# Patient Record
Sex: Male | Born: 1965
Health system: Southern US, Community
[De-identification: ages and names within clinical notes are randomized; demographics above are authoritative.]

## PROBLEM LIST (undated history)

## (undated) DIAGNOSIS — F329 Major depressive disorder, single episode, unspecified: Secondary | ICD-10-CM

## (undated) DIAGNOSIS — Z5189 Encounter for other specified aftercare: Secondary | ICD-10-CM

## (undated) DIAGNOSIS — S62101A Fracture of unspecified carpal bone, right wrist, initial encounter for closed fracture: Secondary | ICD-10-CM

## (undated) DIAGNOSIS — D689 Coagulation defect, unspecified: Secondary | ICD-10-CM

## (undated) DIAGNOSIS — W3400XA Accidental discharge from unspecified firearms or gun, initial encounter: Secondary | ICD-10-CM

## (undated) HISTORY — DX: Encounter for other specified aftercare: Z51.89

## (undated) HISTORY — DX: Coagulation defect, unspecified: D68.9

## (undated) HISTORY — DX: Fracture of unspecified carpal bone, right wrist, initial encounter for closed fracture: S62.101A

## (undated) HISTORY — DX: Major depressive disorder, single episode, unspecified: F32.9

## (undated) HISTORY — DX: Accidental discharge from unspecified firearms or gun, initial encounter: W34.00XA

---

## 1993-11-19 DIAGNOSIS — S62101A Fracture of unspecified carpal bone, right wrist, initial encounter for closed fracture: Secondary | ICD-10-CM

## 1993-11-19 HISTORY — DX: Fracture of unspecified carpal bone, right wrist, initial encounter for closed fracture: S62.101A

## 1993-11-19 HISTORY — PX: WRIST FRACTURE SURGERY: SHX121

## 2000-11-19 DIAGNOSIS — W3400XA Accidental discharge from unspecified firearms or gun, initial encounter: Secondary | ICD-10-CM

## 2000-11-19 HISTORY — PX: ABDOMINAL EXPLORATION SURGERY: SHX538

## 2000-11-19 HISTORY — DX: Accidental discharge from unspecified firearms or gun, initial encounter: W34.00XA

## 2001-05-04 ENCOUNTER — Encounter: Payer: Self-pay | Admitting: General Surgery

## 2001-05-04 ENCOUNTER — Inpatient Hospital Stay (HOSPITAL_COMMUNITY): Admission: AC | Admit: 2001-05-04 | Discharge: 2001-05-26 | Payer: Self-pay | Admitting: *Deleted

## 2001-05-05 ENCOUNTER — Encounter: Payer: Self-pay | Admitting: General Surgery

## 2001-05-06 ENCOUNTER — Encounter: Payer: Self-pay | Admitting: General Surgery

## 2001-05-08 ENCOUNTER — Encounter: Payer: Self-pay | Admitting: General Surgery

## 2001-05-09 ENCOUNTER — Encounter: Payer: Self-pay | Admitting: General Surgery

## 2001-05-10 ENCOUNTER — Encounter: Payer: Self-pay | Admitting: General Surgery

## 2001-05-11 ENCOUNTER — Encounter: Payer: Self-pay | Admitting: General Surgery

## 2001-05-12 ENCOUNTER — Encounter: Payer: Self-pay | Admitting: General Surgery

## 2001-05-13 ENCOUNTER — Encounter: Payer: Self-pay | Admitting: General Surgery

## 2001-05-15 ENCOUNTER — Encounter: Payer: Self-pay | Admitting: General Surgery

## 2001-05-16 ENCOUNTER — Encounter: Payer: Self-pay | Admitting: General Surgery

## 2001-05-17 ENCOUNTER — Encounter: Payer: Self-pay | Admitting: General Surgery

## 2001-05-18 ENCOUNTER — Encounter: Payer: Self-pay | Admitting: General Surgery

## 2001-05-19 ENCOUNTER — Encounter: Payer: Self-pay | Admitting: General Surgery

## 2001-05-20 ENCOUNTER — Encounter: Payer: Self-pay | Admitting: General Surgery

## 2001-06-09 ENCOUNTER — Ambulatory Visit (HOSPITAL_COMMUNITY): Admission: RE | Admit: 2001-06-09 | Discharge: 2001-06-09 | Payer: Self-pay | Admitting: *Deleted

## 2001-06-13 ENCOUNTER — Encounter: Payer: Self-pay | Admitting: Emergency Medicine

## 2001-06-13 ENCOUNTER — Emergency Department (HOSPITAL_COMMUNITY): Admission: EM | Admit: 2001-06-13 | Discharge: 2001-06-13 | Payer: Self-pay | Admitting: Emergency Medicine

## 2001-09-16 ENCOUNTER — Ambulatory Visit (HOSPITAL_COMMUNITY): Admission: RE | Admit: 2001-09-16 | Discharge: 2001-09-16 | Payer: Self-pay | Admitting: Family Medicine

## 2003-01-12 ENCOUNTER — Emergency Department (HOSPITAL_COMMUNITY): Admission: EM | Admit: 2003-01-12 | Discharge: 2003-01-12 | Payer: Self-pay

## 2003-04-28 ENCOUNTER — Emergency Department (HOSPITAL_COMMUNITY): Admission: EM | Admit: 2003-04-28 | Discharge: 2003-04-28 | Payer: Self-pay | Admitting: *Deleted

## 2005-11-26 ENCOUNTER — Emergency Department (HOSPITAL_COMMUNITY): Admission: EM | Admit: 2005-11-26 | Discharge: 2005-11-26 | Payer: Self-pay | Admitting: Family Medicine

## 2007-09-25 ENCOUNTER — Emergency Department (HOSPITAL_COMMUNITY): Admission: EM | Admit: 2007-09-25 | Discharge: 2007-09-26 | Payer: Self-pay | Admitting: Emergency Medicine

## 2010-11-01 ENCOUNTER — Emergency Department (HOSPITAL_COMMUNITY)
Admission: EM | Admit: 2010-11-01 | Discharge: 2010-11-01 | Payer: Self-pay | Source: Home / Self Care | Admitting: Family Medicine
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
## 2011-04-06 NOTE — Op Note (Signed)
[place]. [facility]  Patient:    Todd Holt, Todd Holt                     MRN: [id number] Proc. Date: 05/07/01 Adm. Date:  [id number] Attending:  Trauma, Md                           Operative Report  PREOPERATIVE DIAGNOSIS:  Open abdomen, status post damage control and repair of duodenum and retroperitoneal hemorrhage.  POSTOPERATIVE DIAGNOSIS:  Open abdomen requiring closure.  PROCEDURES: 1. Hepatorrhaphy. 2. Closure of abdominal wound.  SURGEON:  Jimmye Norman, M.D.  ASSISTANT:  Eugenia Pancoast, P.A.  ANESTHESIA:  General endotracheal.  ESTIMATED BLOOD LOSS:  Less than 50 cc.  COMPLICATIONS:  None.  CONDITION:  Stable.  DISPOSITION:  He was still in critical condition when taken back to the ICU directly.  DRAINS:  A 19 mm Blake drain was left coming out of the right upper quadrant, going across the left lobe of the liver.  FINDINGS:  The patient had a small liver laceration on the anterior edge of the left lobe of the liver, which was actively exuding bile.  This was sutured, and a drain was placed anterior to this area.  There was no other further bleeding noted.  There was old blood in the abdomen but no evidence of active bleeding.  The previous anastomoses appeared to be intact and were not stressed during the procedure.  DESCRIPTION OF PROCEDURE:  The patient was taken to the operating room, placed on the table in a supine position.  After an adequate amount of general anesthetic was administered, he was prepped by cutting off the Vi-Drape that had previously been placed and prepped with Betadine, scrubbing the Vi-Drape along the midline.  Initially the _____ bag, which had been sutured to the patients skin circumferentially, was cut out using heavy scissors.  This exposed the small bowel and omentum underneath, which was not extruded outward under pressure. We subsequently explored the belly, starting in the superior aspect of  the wound, where there was noted to be the small liver laceration on the anterior edge of the left lobe of the liver.  Because of bilious drainage there, a figure-of-eight stitch of 0 chromic on a blunt-tip needle was used to suture off this place and then a drain placed anterior to it and brought out the right upper quadrant of the abdomen.  The drain was sutured in place with a 3-0 nylon.  Once this was done, we actively explored all four quadrants of the abdomen, where old blood was found mostly in the left upper quadrant but no active bleeding.  Another retroperitoneal drain was noted intra-abdominally.  It had been draped out of the field externally, and it was not displaced during the procedure.  We explored all four quadrants.  There was no further bleeding, and once were convinced that there was no evidence of undetected perforation or uncontrolled bleeding, we closed the abdomen.  The fascia was reapproximated using a running #1 PDS suture.  Three intervening retention sutures were placed of #1 nylon, which was placed external to the peritoneum using the double needle provided with the suture.  Bridges were used to secure the suture on the anterior abdominal wall after the fascia was closed, and the skin was closed using stainless steel staples.  Not much tension was placed on each retention suture.  They will likely be removed  in the near future.  Once the abdomen was closed completely, we placed a sterile dressing.  The patient was taken directly back to the intensive care unit in stable condition.  Although stable, the patient was critical. DD:  05/08/01 TD:  05/08/01 Job: [id number]

## 2011-08-28 LAB — COMPREHENSIVE METABOLIC PANEL
ALT: 25
AST: 25
Albumin: 3.8
Alkaline Phosphatase: 60
BUN: 10
CO2: 31
Calcium: 9.3
Chloride: 107
Creatinine, Ser: 1.22
GFR calc Af Amer: >60
GFR calc non Af Amer: >60
Glucose, Bld: 104 — ABNORMAL HIGH
Potassium: 4.1
Sodium: 140
Total Bilirubin: 0.7
Total Protein: 6.9

## 2011-08-28 LAB — DIFFERENTIAL
Basophils Absolute: 0
Basophils Relative: 0
Eosinophils Relative: 1
Lymphocytes Relative: 20
Monocytes Absolute: 0.4

## 2011-08-28 LAB — CBC
HCT: 36.3 — ABNORMAL LOW
Hemoglobin: 12.1 — ABNORMAL LOW
MCHC: 33.3
MCV: 88.9
Platelets: 247
RBC: 4.09 — ABNORMAL LOW
RDW: 13.2
WBC: 6.8

## 2011-08-28 LAB — POCT URINALYSIS DIP (DEVICE)
Bilirubin Urine: NEGATIVE
Glucose, UA: NEGATIVE
Hgb urine dipstick: NEGATIVE
Ketones, ur: NEGATIVE
Nitrite: NEGATIVE
Operator id: 247071
Protein, ur: 30 — AB
Specific Gravity, Urine: >=1.03
Urobilinogen, UA: 1
pH: 6.5

## 2013-07-01 ENCOUNTER — Encounter (HOSPITAL_COMMUNITY): Payer: Self-pay | Admitting: Emergency Medicine

## 2013-07-01 ENCOUNTER — Emergency Department (HOSPITAL_COMMUNITY)
Admission: EM | Admit: 2013-07-01 | Discharge: 2013-07-01 | Disposition: A | Discharge status: Home / Self Care | Payer: BC Managed Care – PPO | Source: Home / Self Care | Attending: Emergency Medicine | Admitting: Emergency Medicine

## 2013-07-01 DIAGNOSIS — L03019 Cellulitis of unspecified finger: Secondary | ICD-10-CM

## 2013-07-01 DIAGNOSIS — L03012 Cellulitis of left finger: Secondary | ICD-10-CM

## 2013-07-01 MED ORDER — AMOXICILLIN-POT CLAVULANATE 500-125 MG PO TABS: 1.0000 | ORAL_TABLET | Freq: Three times a day (TID) | ORAL | Status: AC

## 2013-07-01 MED ORDER — HYDROCODONE-ACETAMINOPHEN 5-325 MG PO TABS: 2.0000 | ORAL_TABLET | Freq: Four times a day (QID) | ORAL | Status: DC | PRN

## 2013-07-01 NOTE — ED Notes (Signed)
C/o left ring finger pain which started on Thursday.  Patient states it is swollen.  Patient went to urgent care in [place] and was given pain medication and antibiotics: tramadol and cephalexin

## 2013-07-01 NOTE — ED Provider Notes (Signed)
CSN: [id number]     Arrival date & time 07/01/13  1817 History     First MD Initiated Contact with Patient 07/01/13 2001     Chief Complaint  Patient presents with  . Hand Pain   (Consider location/radiation/quality/duration/timing/severity/associated sxs/prior Treatment) HPI Comments: Patient presents urgent care this evening complaining of an ongoing infection to the medial aspect of his left ring finger. Patient had been seen at urgent care [facility] what he was prescribed antibiotics (Keflex). Is out of her pain travel he describes that the area besides his nail has gotten red and more swollen since then and is not responding to any treatment. It is painful at touch. Denies any fevers or pain to the rest of his hand such as the tip of his finger nor the palmar surface of her finger.  Patient is a 47 y.o. male presenting with hand pain. The history is provided by the patient.  Hand Pain This is a new problem. The current episode started more than 2 days ago. The problem occurs constantly. The problem has been gradually worsening. Treatments tried: Keflex and tramadol.    History reviewed. No pertinent past medical history. No past surgical history on file. No family history on file. History  Substance Use Topics  . Smoking status: Not on file  . Smokeless tobacco: Not on file  . Alcohol Use: Not on file    Review of Systems  Constitutional: Negative for fever, activity change, appetite change and unexpected weight change.  Musculoskeletal: Negative for myalgias, back pain, joint swelling and gait problem.  Skin: Positive for color change and wound.    Allergies  Review of patient's allergies indicates no known allergies.  Home Medications   Current Outpatient Rx  Name  Route  Sig  Dispense  Refill  . traMADol (ULTRAM) 50 MG tablet   Oral   Take 50 mg by mouth every 6 (six) hours as needed for pain.         Marland Kitchen amoxicillin-clavulanate (AUGMENTIN) 500-125 MG per  tablet   Oral   Take 1 tablet (500 mg total) by mouth 3 (three) times daily.   21 tablet   0   . HYDROcodone-acetaminophen (NORCO/VICODIN) 5-325 MG per tablet   Oral   Take 2 tablets by mouth every 6 (six) hours as needed for pain.   10 tablet   0    BP 147/80  Pulse 77  Temp(Src) 98.1 F (36.7 C) (Oral)  Resp 18  SpO2 97% Physical Exam  Vitals reviewed. Constitutional: Vital signs are normal. He appears well-developed and well-nourished.  Musculoskeletal: He exhibits tenderness.       Hands: Neurological: He is alert.  Skin: No rash noted. There is erythema.    ED Course   Drain paronychia Date/Time: 07/01/2013 8:43 PM Performed by: Jaslynn Thome Authorized by: Jimmie Molly Consent: Verbal consent obtained. Consent given by: patient Patient identity confirmed: verbally with patient Patient sedated: no Patient tolerance: Patient tolerated the procedure well with no immediate complications. Comments: Is 11 blade and puncture site a paronychia obtaining a purulent discharge a sample was collected for cultures.   (including critical care time)  Labs Reviewed  CULTURE, ROUTINE-ABSCESS   No results found. 1. Paronychia of finger, left     MDM  Left fourth finger paronychia  Patient tolerated I&D, was able to drain moderate amount of purulent exudate Patient has been instructed to soak his affected finger in soapy luke- water.  2 discontinue Keflex and start with  Augmentin. Patient was instructed that if no improvement is noted in the next 48 hours or infection spreads to the tip of his finger or to the rest of his finger as explained to return or go to the emergency department. Patient understands discharge instructions and agreed with treatment plan and followup care as necessary.  Jimmie Molly, MD 07/01/13 2045

## 2013-07-03 NOTE — ED Notes (Signed)
Chart review.

## 2013-07-05 LAB — CULTURE, ROUTINE-ABSCESS

## 2013-07-06 NOTE — ED Notes (Signed)
Abscess culture L finger: few staph species (coagulase negative).  Pt. adequately treated with Augementin. Todd Holt 07/06/2013

## 2013-09-07 ENCOUNTER — Ambulatory Visit: Payer: BC Managed Care – PPO | Admitting: Family Medicine

## 2013-10-08 ENCOUNTER — Ambulatory Visit (INDEPENDENT_AMBULATORY_CARE_PROVIDER_SITE_OTHER): Payer: BC Managed Care – PPO | Admitting: Family Medicine

## 2013-10-08 ENCOUNTER — Encounter: Payer: Self-pay | Admitting: Family Medicine

## 2013-10-08 VITALS — BP 120/80 | HR 76 | Temp 98.4°F | Ht 67.0 in | Wt 200.2 lb

## 2013-10-08 DIAGNOSIS — Z113 Encounter for screening for infections with a predominantly sexual mode of transmission: Secondary | ICD-10-CM

## 2013-10-08 DIAGNOSIS — L84 Corns and callosities: Secondary | ICD-10-CM

## 2013-10-08 DIAGNOSIS — Z23 Encounter for immunization: Secondary | ICD-10-CM

## 2013-10-08 DIAGNOSIS — Z0000 Encounter for general adult medical examination without abnormal findings: Secondary | ICD-10-CM | POA: Insufficient documentation

## 2013-10-08 MED ORDER — UREA 40 % EX CREA: TOPICAL_CREAM | CUTANEOUS | Status: DC

## 2013-10-08 NOTE — Assessment & Plan Note (Signed)
Preventative protocols reviewed and updated unless pt declined. Discussed healthy diet and lifestyle.   Std screen per patient request.

## 2013-10-08 NOTE — Assessment & Plan Note (Signed)
?  plantar wart causing callus on right lateral sole. Recommended treat with urea cream (sent to pharmacy), pumice stone, and moisturizing daily. Update if this doesn't help.

## 2013-10-08 NOTE — Addendum Note (Signed)
Addended by: Josph Macho A on: 10/08/2013 04:18 PM   Modules accepted: Orders

## 2013-10-08 NOTE — Patient Instructions (Signed)
Tdap today (tetanus and pertussis)  For callus - try urea cream (sent to pharmacy) then file down callus with pumice stone, then wash feet and moisturize - do this daily for several weeks. Return at your convenience fasting for blood work (6-8 hours).  We will check cholesterol levels then as well as sugar and HIV/syphillis. Good to meet you today, call us with questions.

## 2013-10-08 NOTE — Progress Notes (Signed)
Pre-visit discussion using our clinic review tool. No additional management support is needed unless otherwise documented below in the visit note.  
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
## 2013-10-20 ENCOUNTER — Other Ambulatory Visit (INDEPENDENT_AMBULATORY_CARE_PROVIDER_SITE_OTHER): Payer: BC Managed Care – PPO

## 2013-10-20 DIAGNOSIS — Z0000 Encounter for general adult medical examination without abnormal findings: Secondary | ICD-10-CM

## 2013-10-20 DIAGNOSIS — Z113 Encounter for screening for infections with a predominantly sexual mode of transmission: Secondary | ICD-10-CM

## 2013-10-20 LAB — BASIC METABOLIC PANEL
CO2: 28 mEq/L (ref 19–32)
Calcium: 9.5 mg/dL (ref 8.4–10.5)
Creatinine, Ser: 1.1 mg/dL (ref 0.4–1.5)

## 2013-10-20 LAB — RPR

## 2013-10-20 LAB — LIPID PANEL
Cholesterol: 231 mg/dL — ABNORMAL HIGH (ref 0–200)
Total CHOL/HDL Ratio: 4
Triglycerides: 81 mg/dL (ref 0.0–149.0)

## 2013-10-21 LAB — HIV ANTIBODY (ROUTINE TESTING W REFLEX): HIV: NONREACTIVE

## 2013-10-22 ENCOUNTER — Encounter: Payer: Self-pay | Admitting: *Deleted

## 2013-12-20 DIAGNOSIS — F32A Depression, unspecified: Secondary | ICD-10-CM

## 2013-12-20 HISTORY — DX: Depression, unspecified: F32.A

## 2013-12-28 ENCOUNTER — Encounter (HOSPITAL_COMMUNITY): Payer: Self-pay | Admitting: Behavioral Health

## 2013-12-28 ENCOUNTER — Encounter (HOSPITAL_COMMUNITY): Payer: Self-pay | Admitting: Emergency Medicine

## 2013-12-28 ENCOUNTER — Emergency Department (HOSPITAL_COMMUNITY)
Admission: EM | Admit: 2013-12-28 | Discharge: 2013-12-28 | Disposition: A | Discharge status: Other Acute Inpatient Hospital | Payer: BC Managed Care – PPO | Attending: Emergency Medicine | Admitting: Emergency Medicine

## 2013-12-28 ENCOUNTER — Inpatient Hospital Stay (HOSPITAL_COMMUNITY)
Admission: RE | Admit: 2013-12-28 | Discharge: 2013-12-31 | DRG: 885 | Disposition: A | Discharge status: Home / Self Care | Payer: BC Managed Care – PPO | Source: Intra-hospital | Attending: Psychiatry | Admitting: Psychiatry

## 2013-12-28 DIAGNOSIS — F332 Major depressive disorder, recurrent severe without psychotic features: Principal | ICD-10-CM | POA: Diagnosis present

## 2013-12-28 DIAGNOSIS — F419 Anxiety disorder, unspecified: Secondary | ICD-10-CM

## 2013-12-28 DIAGNOSIS — R45851 Suicidal ideations: Secondary | ICD-10-CM

## 2013-12-28 DIAGNOSIS — Z87891 Personal history of nicotine dependence: Secondary | ICD-10-CM | POA: Insufficient documentation

## 2013-12-28 DIAGNOSIS — F329 Major depressive disorder, single episode, unspecified: Secondary | ICD-10-CM | POA: Diagnosis present

## 2013-12-28 DIAGNOSIS — F1010 Alcohol abuse, uncomplicated: Secondary | ICD-10-CM | POA: Diagnosis present

## 2013-12-28 DIAGNOSIS — Z8781 Personal history of (healed) traumatic fracture: Secondary | ICD-10-CM | POA: Insufficient documentation

## 2013-12-28 DIAGNOSIS — F411 Generalized anxiety disorder: Secondary | ICD-10-CM | POA: Insufficient documentation

## 2013-12-28 LAB — COMPREHENSIVE METABOLIC PANEL
ALT: 23 U/L (ref 0–53)
AST: 22 U/L (ref 0–37)
Albumin: 4.5 g/dL (ref 3.5–5.2)
Alkaline Phosphatase: 54 U/L (ref 39–117)
BUN: 12 mg/dL (ref 6–23)
CALCIUM: 9.7 mg/dL (ref 8.4–10.5)
CO2: 27 meq/L (ref 19–32)
CREATININE: 0.94 mg/dL (ref 0.50–1.35)
Chloride: 100 mEq/L (ref 96–112)
GLUCOSE: 106 mg/dL — AB (ref 70–99)
Potassium: 4.4 mEq/L (ref 3.7–5.3)
Sodium: 139 mEq/L (ref 137–147)
Total Bilirubin: 1.1 mg/dL (ref 0.3–1.2)
Total Protein: 8.3 g/dL (ref 6.0–8.3)

## 2013-12-28 LAB — ACETAMINOPHEN LEVEL

## 2013-12-28 LAB — CBC
HEMATOCRIT: 42.6 % (ref 39.0–52.0)
HEMOGLOBIN: 14.3 g/dL (ref 13.0–17.0)
MCH: 29.8 pg (ref 26.0–34.0)
MCHC: 33.6 g/dL (ref 30.0–36.0)
MCV: 88.8 fL (ref 78.0–100.0)
Platelets: 266 10*3/uL (ref 150–400)
RBC: 4.8 MIL/uL (ref 4.22–5.81)
RDW: 13.1 % (ref 11.5–15.5)
WBC: 5.4 10*3/uL (ref 4.0–10.5)

## 2013-12-28 LAB — SALICYLATE LEVEL

## 2013-12-28 LAB — RAPID URINE DRUG SCREEN, HOSP PERFORMED
Amphetamines: NOT DETECTED
Barbiturates: NOT DETECTED
Benzodiazepines: NOT DETECTED
Cocaine: NOT DETECTED
OPIATES: NOT DETECTED
Tetrahydrocannabinol: NOT DETECTED

## 2013-12-28 LAB — ETHANOL: Alcohol, Ethyl (B): <11 mg/dL (ref 0–11)

## 2013-12-28 MED ORDER — TRAZODONE HCL 50 MG PO TABS
50.0000 mg | ORAL_TABLET | Freq: Every evening | ORAL | Status: DC | PRN
  Administered 2013-12-28 – 2013-12-29 (×2): 50 mg via ORAL
  Filled 2013-12-28 (×2): qty 1

## 2013-12-28 MED ORDER — HYDROXYZINE HCL 25 MG PO TABS
25.0000 mg | ORAL_TABLET | Freq: Four times a day (QID) | ORAL | Status: DC | PRN
  Filled 2013-12-28: qty 10

## 2013-12-28 MED ORDER — ALUM & MAG HYDROXIDE-SIMETH 200-200-20 MG/5ML PO SUSP: 30.0000 mL | ORAL | Status: DC | PRN

## 2013-12-28 MED ORDER — ACETAMINOPHEN 325 MG PO TABS: 650.0000 mg | ORAL_TABLET | Freq: Four times a day (QID) | ORAL | Status: DC | PRN

## 2013-12-28 MED ORDER — MAGNESIUM HYDROXIDE 400 MG/5ML PO SUSP: 30.0000 mL | Freq: Every day | ORAL | Status: DC | PRN

## 2013-12-28 MED ORDER — FLUOXETINE HCL 20 MG PO CAPS
20.0000 mg | ORAL_CAPSULE | Freq: Every day | ORAL | Status: DC
  Administered 2013-12-28 – 2013-12-31 (×4): 20 mg via ORAL
  Filled 2013-12-28: qty 3
  Filled 2013-12-28 (×6): qty 1

## 2013-12-28 NOTE — BHH Counselor (Addendum)
Pt signed support paperwork. Is going to bed 503-1 per Ava. Writer spoke w/ Charge RN Butch Penny who sts bed isn't ready yet as maintenance being done on Room 503 shower.   Arnold Long, [place] Assessment Counselor
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
## 2013-12-28 NOTE — ED Notes (Signed)
Pt sent here for medical clearance by [facility]. PT states he has felt stress recently. Denies SI/HI. Pt occasionally drinks etoh, last drink Saturday. PT stressed due to family situation

## 2013-12-28 NOTE — Progress Notes (Signed)
The patient is a walk-in assessment at [facility].  Writer consulted with Dr. Darleene Cleaver regarding the patient.  Per Dr. Darleene Cleaver the patient has been accepted to [facility] Bed 503-1.    Writer informed the charge nurse that the patient will be coming to [facility] for medical clearance.  Writer also informed Theodoro Clock, NP stationed  [facility] of the patients disposition.   The TTS counselor Arby Barrette) stationed at [facility] will complete the support paperwork and fax to [facility].

## 2013-12-28 NOTE — Tx Team (Signed)
Initial Interdisciplinary Treatment Plan  PATIENT STRENGTHS: (choose at least two) Ability for insight Capable of independent living Motivation for treatment/growth  PATIENT STRESSORS: Marital or family conflict Substance abuse   PROBLEM LIST: Problem List/Patient Goals Date to be addressed Date deferred Reason deferred Estimated date of resolution  Substance abuse 11/28/39     Family conflict  05/23/07                                                DISCHARGE CRITERIA:  Ability to meet basic life and health needs Adequate post-discharge living arrangements Improved stabilization in mood, thinking, and/or behavior Verbal commitment to aftercare and medication compliance  PRELIMINARY DISCHARGE PLAN: Attend aftercare/continuing care group Attend PHP/IOP  PATIENT/FAMIILY INVOLVEMENT: This treatment plan has been presented to and reviewed with the patient, PAYTEN BEAUMIER, and/or family member.  The patient and family have been given the opportunity to ask questions and make suggestions.  Swayzee Wadley L 12/28/2013, 5:00 PM

## 2013-12-28 NOTE — Progress Notes (Signed)
Adult Psychoeducational Group Note  Date:  12/28/2013 Time:  10:15 PM  Group Topic/Focus:  Goals Group:   The focus of this group is to help patients establish daily goals to achieve during treatment and discuss how the patient can incorporate goal setting into their daily lives to aide in recovery.  Participation Level:  Active  Participation Quality:  Appropriate  Affect:  Appropriate  Cognitive:  Appropriate  Insight: Appropriate  Engagement in Group:  Engaged  Modes of Intervention:  Discussion  Additional Comments:  Pt just got here and waiting to see how things are going to go as far as his treatment.  Alexis Goodell R 12/28/2013, 10:15 PM
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
## 2013-12-28 NOTE — Progress Notes (Signed)
Admission note: Pt reports increasing depression that started yesterday. Pt stated that he woke up feeling depressed, "weak and not wanting to do anything". Pt denies any stressors or past hx of depression. During admission process pt became tearful. Pt is guarded, forwards little information and have some thought blocking. Writer was not able to obtain any information for treatment regarding why he is here for treatment. Writer encouraged pt to verbalize his feelings and emotions to staff. Pt explained the policy here at [facility] and brought back onto the unit. Pt safety maintained.

## 2013-12-28 NOTE — ED Notes (Signed)
Spoke with TTS. Waiting on bed at [facility]
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
## 2013-12-29 ENCOUNTER — Encounter (HOSPITAL_COMMUNITY): Payer: Self-pay | Admitting: Family

## 2013-12-29 DIAGNOSIS — F1010 Alcohol abuse, uncomplicated: Secondary | ICD-10-CM

## 2013-12-29 DIAGNOSIS — R45851 Suicidal ideations: Secondary | ICD-10-CM

## 2013-12-29 DIAGNOSIS — F329 Major depressive disorder, single episode, unspecified: Secondary | ICD-10-CM

## 2013-12-29 NOTE — Progress Notes (Signed)
Patient ID: Todd Holt, male   DOB: 1966/06/11, 48 y.o.   MRN: [id number] D-Patient report she slept well and "I feel much better today after a good nights sleep"He reports his appetite is good and his ability to pay attention is good.  He is denying depression, hopelessness and anxiety.  A- Supported patient.  He was given a small birthday cake to celebrate his birthday today. R- Patient was appreciative of cake.  He has been attending groups and interacting with peer and staff.

## 2013-12-29 NOTE — Progress Notes (Signed)
Adult Psychoeducational Group Note  Date:  12/29/2013 Time:  2000   Group Topic/Focus:  Wrap-Up Group:   The focus of this group is to help patients review their daily goal of treatment and discuss progress on daily workbooks.  Participation Level:  Minimal  Participation Quality:  Inattentive  Affect:  Flat  Cognitive:  Alert  Insight: Limited  Engagement in Group:  Limited  Modes of Intervention:  Education and Support  Additional Comments:    Jacob Moores Monique 12/29/2013, 10:24 PM

## 2013-12-29 NOTE — Progress Notes (Signed)
Recreation Therapy Notes  Animal-Assisted Activity/Therapy (AAA/T) Program Checklist/Progress Notes Patient Eligibility Criteria Checklist & Daily Group note for Rec Tx Intervention  Date: 02.10.2015 Time: 2:45pm Location: [address]   AAA/T Program Assumption of Risk Form signed by Patient/ or Parent Legal Guardian yes  Patient is free of allergies or sever asthma yes  Patient reports no fear of animals yes  Patient reports no history of cruelty to animals yes   Patient understands his/her participation is voluntary yes  Patient washes hands before animal contact yes  Patient washes hands after animal contact yes  Behavioral Response: Appropriate   Education: Hand Washing, Appropriate Animal Interaction   Education Outcome: Acknowledges understanding   Clinical Observations/Feedback: Patient interacted appropriately with therapeutic dog team.   Lane Hacker, LRT/CTRS  Courney Garrod L 12/29/2013 4:36 PM

## 2013-12-29 NOTE — BHH Suicide Risk Assessment (Signed)
   Nursing information obtained from:  Patient Demographic factors:  Male Current Mental Status:  Self-harm thoughts Loss Factors:  NA Historical Factors:  NA Risk Reduction Factors:  Responsible for children under 47 years of age;Sense of responsibility to family;Employed;Positive social support Total Time spent with patient: 45 minutes  CLINICAL FACTORS:   Severe Anxiety and/or Agitation Depression:   Anhedonia Comorbid alcohol abuse/dependence Hopelessness Impulsivity Insomnia Recent sense of peace/wellbeing Severe Alcohol/Substance Abuse/Dependencies Previous Psychiatric Diagnoses and Treatments Medical Diagnoses and Treatments/Surgeries  Psychiatric Specialty Exam: Physical Exam  Constitutional: He is oriented to person, place, and time. He appears well-developed.  HENT:  Head: Normocephalic.  Eyes: Pupils are equal, round, and reactive to light.  Neck: Normal range of motion.  Cardiovascular: Normal rate.   Respiratory: Effort normal.  GI: Soft.  Musculoskeletal: Normal range of motion.  Neurological: He is alert and oriented to person, place, and time.  Skin: Skin is warm.    Review of Systems  Psychiatric/Behavioral: Positive for depression, suicidal ideas and substance abuse. The patient is nervous/anxious and has insomnia.   All other systems reviewed and are negative.    Blood pressure 132/89, pulse 83, temperature 97.5 F (36.4 C), temperature source Oral, resp. rate 18, height 5\' 7"  (1.702 m), weight 87.091 kg (192 lb).Body mass index is 30.06 kg/(m^2).  General Appearance: Casual  Eye Contact::  Fair  Speech:  Clear and Coherent  Volume:  Decreased  Mood:  Anxious and Depressed  Affect:  Constricted and Depressed  Thought Process:  Goal Directed and Intact  Orientation:  Full (Time, Place, and Person)  Thought Content:  Rumination  Suicidal Thoughts:  Yes.  without intent/plan  Homicidal Thoughts:  No  Memory:  Immediate;   Fair  Judgement:   Impaired  Insight:  Lacking  Psychomotor Activity:  Normal  Concentration:  Fair  Recall:  [employer] of Knowledge:Good  Language: Good  Akathisia:  No  Handed:  Right  AIMS (if indicated):     Assets:  Communication Skills Desire for Improvement Financial Resources/Insurance Housing Leisure Time Physical Health Resilience Social Support  Sleep:  Number of Hours: 5.5   Musculoskeletal: Strength & Muscle Tone: within normal limits Gait & Station: normal Patient leans: N/A  COGNITIVE FEATURES THAT CONTRIBUTE TO RISK:  Closed-mindedness Loss of executive function Polarized thinking    SUICIDE RISK:   Moderate:  Frequent suicidal ideation with limited intensity, and duration, some specificity in terms of plans, no associated intent, good self-control, limited dysphoria/symptomatology, some risk factors present, and identifiable protective factors, including available and accessible social support.  PLAN OF CARE: Admit for crisis stabilization, safety margin and medication management could be the depressive disorder with suicidal ideation and alcohol abuse.  I certify that inpatient services furnished can reasonably be expected to improve the patient's condition.  Analee Montee,JANARDHAHA R. 12/29/2013, 1:50 PM

## 2013-12-29 NOTE — BHH Group Notes (Signed)
[place] LCSW Group Therapy      Feelings About Diagnosis 1:15 - 2:30 PM         12/29/2013  3:04 PM    Type of Therapy:  Group Therapy  Participation Level:  Active  Participation Quality:  Appropriate  Affect:  Appropriate  Cognitive:  Alert and Appropriate  Insight:  Developing/Improving and Engaged  Engagement in Therapy:  Developing/Improving and Engaged  Modes of Intervention:  Discussion, Education, Exploration, Problem-Solving, Rapport Building, Support  Summary of Progress/Problems:  Patient actively participated in group. Patient discussed past and present diagnosis and the effects it has had on  life.  Patient talked about family and society being judgmental and the stigma associated with having a mental health diagnosis.  He advised he accepts things were not going well for him prior to admission.  He shared he has to accept that even though people may treat you differently, he has to accept help.  Todd Holt 12/29/2013  3:04 PM

## 2013-12-29 NOTE — Progress Notes (Signed)
The focus of this group is to educate the patient on the purpose and policies of crisis stabilization and provide a format to answer questions about their admission.  The group details unit policies and expectations of patients while admitted.  Patient attended 0900 nurse education orientation group this morning.  Patient actively participated, appropriate affect, alert, appropriate insight and engagement.  Today patient will work on 3 goals for discharge.  

## 2013-12-29 NOTE — BHH Suicide Risk Assessment (Signed)
[name] INPATIENT:  Family/Significant Other Suicide Prevention Education  Suicide Prevention Education:  Education Completed; Todd Holt, Wife, [name] Todd Holt [phone] has been identified by the patient as the family member/significant other with whom the patient will be residing, and identified as the person(s) who will aid the patient in the event of a mental health crisis (suicidal ideations/suicide attempt).  With written consent from the patient, the family member/significant other has been provided the following suicide prevention education, prior to the and/or following the discharge of the patient.  The suicide prevention education provided includes the following:  Suicide risk factors  Suicide prevention and interventions  National Suicide Hotline telephone number  [facility] assessment telephone number  [facility] Emergency Assistance [place] and/or Residential Mobile Crisis Unit telephone number  Request made of family/significant other to:  Remove weapons (e.g., guns, rifles, knives), all items previously/currently identified as safety concern.Wife advised patient does not have access to weapons.  Family member to secure guns prior to patient discharging.    Remove drugs/medications (over-the-counter, prescriptions, illicit drugs), all items previously/currently identified as a safety concern.  The family member/significant other verbalizes understanding of the suicide prevention education information provided.  The family member/significant other agrees to remove the items of safety concern listed above.  Todd Holt 12/29/2013, 12:08 PM
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
## 2013-12-29 NOTE — Clinical Social Work Note (Signed)
Patient asked for and received a letter regarding time of presentation and admission to hospital.
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
## 2013-12-30 DIAGNOSIS — F4325 Adjustment disorder with mixed disturbance of emotions and conduct: Secondary | ICD-10-CM

## 2013-12-30 MED ORDER — DIPHENHYDRAMINE HCL 50 MG PO CAPS
50.0000 mg | ORAL_CAPSULE | Freq: Every evening | ORAL | Status: DC | PRN
  Administered 2013-12-30: 50 mg via ORAL
  Filled 2013-12-30 (×4): qty 1
  Filled 2013-12-30: qty 2
  Filled 2013-12-30 (×2): qty 1
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
## 2013-12-30 NOTE — Progress Notes (Signed)
Adult Psychoeducational Group Note  Date:  12/30/2013 Time:  9:08 PM  Group Topic/Focus:  Wrap-Up Group:   The focus of this group is to help patients review their daily goal of treatment and discuss progress on daily workbooks.  Participation Level:  Active  Participation Quality:  Appropriate  Affect:  Appropriate  Cognitive:  Appropriate  Insight: Appropriate  Engagement in Group:  Engaged  Modes of Intervention:  Discussion  Additional Comments: The patient  expressed that he had a great day with staff and peers.  Nash Shearer 12/30/2013, 9:08 PM

## 2013-12-30 NOTE — Progress Notes (Signed)
Adult Psychoeducational Group Note  Date:  12/30/2013 Time:  10:00am Group Topic/Focus:  Personal Choices and Values:   The focus of this group is to help patients assess and explore the importance of values in their lives, how their values affect their decisions, how they express their values and what opposes their expression.  Participation Level:  Active  Participation Quality:  Appropriate and Attentive  Affect:  Appropriate  Cognitive:  Alert and Appropriate  Insight: Appropriate  Engagement in Group:  Engaged  Modes of Intervention:  Discussion and Education  Additional Comments:  Pt attended and participated in group. Discussion was on personal development. Pt stated personal development means having a positive attitude.  Marlowe Shores D 12/30/2013, 1:57 PM
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
## 2013-12-30 NOTE — Progress Notes (Signed)
D: Patient in the hallway on approach.  Patient states he is feeling much better.  Patient states he is ready to go back home and get back to his everyday life.  Patient states it seems that his situation is not as bad as he originally thought due to him not remembering the night.  Patient states he has been in contact with his family and they have been all positive conversations.  Patient is assertive with talking to writer but when asked about the situation that got him here he is guarded.  Patient denies SI/HI and denies AVH. A: Staff to monitor Q 15 mins for safety.  Encouragement and support offered.  No scheduled medications administered per orders.  R: Patient remains safe on the unit.  Patient attended group tonight.  Patient visible on the unit and interacting with peers.  Patient taking administered medications.

## 2013-12-30 NOTE — Progress Notes (Signed)
D: Patient denies SI/HI and A/V hallucinations; patient reports sleep is well; reports appetite is good; reports energy level is high ; reports ability to pay attention is good; rates depression as 1/10; rates hopelessness 1/10; rates anxiety as 1/10; patient reports that he is ready to go home  A: Monitored q 15 minutes; patient encouraged to attend groups; patient educated about medications; patient given medications per physician orders; patient encouraged to express feelings and/or concerns  R: Patient is appropriate to circumstances and cooperative; patient is pleasant and engages appropriately; patient is animated and has a lot of energy during interaction with others; patient is taking medications as prescribed and tolerating medications; patient is attending all groups and engaging and appears to have some insight

## 2013-12-30 NOTE — BHH Group Notes (Signed)
[facility] LCSW Aftercare Discharge Planning Group Note   12/30/2013 11:13 AM    Participation Quality:  Appropraite  Mood/Affect:  Appropriate  Depression Rating:  1  Anxiety Rating:  1  Thoughts of Suicide:  No  Will you contract for safety?   NA  Current AVH:  No  Plan for Discharge/Comments:  Patient attended discharge planning group and actively participated in group.  He reports feeling much better today and hopes to discharge home soon.  He will follow up with [employer] for follow up.  CSW provided all participants with daily workbook.   Transportation Means: Patient has transportation.   Supports:  Patient has a support system.   Teila Skalsky, Eulas Post

## 2013-12-31 MED ORDER — HYDROXYZINE HCL 25 MG PO TABS: 25.0000 mg | ORAL_TABLET | Freq: Four times a day (QID) | ORAL | Status: DC | PRN

## 2013-12-31 MED ORDER — FLUOXETINE HCL 20 MG PO CAPS: 20.0000 mg | ORAL_CAPSULE | Freq: Every day | ORAL | Status: DC

## 2013-12-31 NOTE — Progress Notes (Signed)
Patient ID: Todd Holt, male   DOB: 1966-08-29, 48 y.o.   MRN: [id number]  [facility] 9 A.M.  The focus of this group is to educate the patient on the purpose and policies of crisis stabilization and provide a format to answer questions about their admission.  The group details unit policies and expectations of patients while admitted.  Patient attended group but came in late due to talking to MD. Patient reports that a goal of his is to spend more time with family. Patient also reported that his leisure activities are working out, helping others, and swimming.
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
## 2013-12-31 NOTE — Progress Notes (Signed)
Patient ID: VIVIANO BIR, male   DOB: Oct 12, 1966, 48 y.o.   MRN: [id number]  Pt. Denies SI/HI and A/V hallucinations. Patient rates his depression and hopelessness at 1/10 for the day. Belongings returned to patient at time of discharge. Patient denies any pain or discomfort. Discharge instructions and medications were reviewed with patient. Patient verbalized understanding of both medications and discharge instructions. Q15 minute safety checks were maintained until discharge. No distress noted upon discharge.
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
## 2013-12-31 NOTE — Progress Notes (Signed)
[facility] Adult Case Management Discharge Plan :  Will you be returning to the same living situation after discharge: Yes,  Patient is returning to his home. At discharge, do you have transportation home?:Yes,  Patient to arrange transportation Do you have the ability to pay for your medications:Yes,  Patient is able to obtain medications  Release of information consent forms completed and in the chart;  Patient's signature needed at discharge.  Patient to Follow up at: Follow-up Information   Follow up with [facility] On 01/04/2014. (Please go to [facility] clinic on Monday, December 04, 2013 or any weekday between [place] for medication managment and counseling)    Contact information:   [address] [place], [place]   [address]  [phone]      Patient denies SI/HI:   Patient no longer endorsing SI/HI or other thoughts of self harm.     Safety Planning and Suicide Prevention discussed: .Reviewed with all patients during discharge planning group  Ysabel Cowgill, Eulas Post 12/31/2013, 10:43 AM

## 2013-12-31 NOTE — BHH Group Notes (Signed)
[facility] LCSW Group Therapy  12/31/2013  1:15 PM   Type of Therapy:  Group Therapy  Participation Level:  Active  Participation Quality:  Attentive, Sharing and Supportive  Affect:  Depressed and Flat  Cognitive:  Alert and Oriented  Insight:  Developing/Improving and Engaged  Engagement in Therapy:  Developing/Improving, Engaged and Supportive  Modes of Intervention:  Activity, Clarification, Confrontation, Discussion, Education, Exploration, Limit-setting, Orientation, Problem-solving, Rapport Building, Art therapist, Socialization and Support  Summary of Progress/Problems: Patient was attentive and engaged with speaker from [facility].  Patient was attentive to speaker while they shared their story of dealing with mental health and overcoming it.  Patient expressed interest in their programs and services and received information on their agency.  Patient processed ways they can relate to the speaker.     Regan Lemming, LCSW 12/31/2013 1:39 PM

## 2013-12-31 NOTE — Progress Notes (Signed)
Recreation Therapy Notes  Date: 02.11.2015 Time: 2:45pm Location: [address] Dayroom   Group Topic: Anger Management  Goal Area(s) Addresses:  Patient will identify body's physical reaction to anger.  Patient will identify positive coping mechanisms to deal with anger.  Patient will select one coping mechanism of choice to use post d/c when experiencing anger.   Behavioral Response: Appropriate  Intervention: Art  Activity: Patients were divided into groups, one member was selected to be traced by LRT. Patients were asked to identify reactions to anger, using outline they were asked to place reactions on the corresponding section of the body. Patients were then asked to identify positive coping skills to use when experiencing anger, using the outline patients were asked to place the coping skills on the area of the body used to complete that coping skill.     Education: Anger Management, Discharge Planning, Coping Skills  Education Outcome: Acknowledges understanding   Clinical Observations/Feedback: Patient actively engaged in group session, working well with his team to identify reactions to anger, as well as coping skills. Patient contributed to group discussion, identifying benefit of using coping skills, as well as discussed healthy ways to process his anger.    Laureen Ochs Tessa Seaberry, LRT/CTRS   Clayson Riling L 12/31/2013 9:35 AM

## 2014-01-03 ENCOUNTER — Encounter: Payer: Self-pay | Admitting: Family Medicine

## 2014-01-04 NOTE — Progress Notes (Signed)
Patient Discharge Instructions:  After Visit Summary (AVS):   Faxed to:  01/04/14 Discharge Summary Note:   Faxed to:  01/04/14 Psychiatric Admission Assessment Note:   Faxed to:  01/04/14 Suicide Risk Assessment - Discharge Assessment:   Faxed to:  01/04/14 Faxed/Sent to the Next Level Care provider:  01/04/14 Faxed to [facility] @ [address] 01/04/2014, 4:15 PM

## 2014-01-20 ENCOUNTER — Other Ambulatory Visit (INDEPENDENT_AMBULATORY_CARE_PROVIDER_SITE_OTHER): Payer: BC Managed Care – PPO

## 2014-01-20 DIAGNOSIS — E785 Hyperlipidemia, unspecified: Secondary | ICD-10-CM

## 2014-01-21 ENCOUNTER — Other Ambulatory Visit: Payer: Self-pay | Admitting: Family Medicine

## 2014-01-21 DIAGNOSIS — E785 Hyperlipidemia, unspecified: Secondary | ICD-10-CM | POA: Insufficient documentation

## 2014-01-21 LAB — LIPID PANEL
Cholesterol: 211 mg/dL — ABNORMAL HIGH (ref 0–200)
HDL: 59.9 mg/dL (ref >39.00)
LDL Cholesterol: 142 mg/dL — ABNORMAL HIGH (ref 0–99)
TRIGLYCERIDES: 48 mg/dL (ref 0.0–149.0)
Total CHOL/HDL Ratio: 4
VLDL: 9.6 mg/dL (ref 0.0–40.0)

## 2014-01-22 ENCOUNTER — Encounter: Payer: Self-pay | Admitting: Family Medicine

## 2014-12-16 ENCOUNTER — Encounter: Payer: Self-pay | Admitting: Family Medicine

## 2014-12-16 ENCOUNTER — Ambulatory Visit (INDEPENDENT_AMBULATORY_CARE_PROVIDER_SITE_OTHER): Payer: BLUE CROSS/BLUE SHIELD | Admitting: Family Medicine

## 2014-12-16 VITALS — BP 124/90 | HR 68 | Temp 98.2°F | Wt 201.5 lb

## 2014-12-16 DIAGNOSIS — H6122 Impacted cerumen, left ear: Secondary | ICD-10-CM

## 2014-12-16 DIAGNOSIS — H6120 Impacted cerumen, unspecified ear: Secondary | ICD-10-CM | POA: Insufficient documentation

## 2014-12-16 MED ORDER — HYDROCORTISONE-ACETIC ACID 1-2 % OT SOLN: 4.0000 [drp] | Freq: Two times a day (BID) | OTIC | Status: DC

## 2014-12-16 NOTE — Assessment & Plan Note (Addendum)
Left cerumen impaction leading to irritant external otitis Disimpaction performed by myself today. Provided with preventative vosol HC drops with indications on when to fill.

## 2014-12-16 NOTE — Progress Notes (Signed)
Pre visit review using our clinic review tool, if applicable. No additional management support is needed unless otherwise documented below in the visit note. 

## 2014-12-16 NOTE — Patient Instructions (Signed)
I think ear wax ball was irritating external canal. ear cleaned today. Fill ear drops if persistent earache.

## 2014-12-16 NOTE — Progress Notes (Signed)
   BP 124/90 mmHg  Pulse 68  Temp(Src) 98.2 F (36.8 C) (Oral)  Wt 201 lb 8 oz (91.4 kg)   CC: L earache  Subjective:    Patient ID: Todd Holt, male    DOB: 24-Jun-1966, 49 y.o.   MRN: [id number]  HPI: Todd Holt is a 49 y.o. male presenting on 12/16/2014 for Otalgia   L earache. Ongoing for last month. Had viral URI a month ago with some muffled hearing at that time, that is when earache started. Denies recent swimming.   No drainage from ear, no hearing changes. Rare tinnitus. No fevers/chills, congestion, cough.   No ear surgeries in the past.  Relevant past medical, surgical, family and social history reviewed and updated as indicated. Interim medical history since our last visit reviewed. Allergies and medications reviewed and updated. No current outpatient prescriptions on file prior to visit.   No current facility-administered medications on file prior to visit.    Review of Systems Per HPI unless specifically indicated above     Objective:    BP 124/90 mmHg  Pulse 68  Temp(Src) 98.2 F (36.8 C) (Oral)  Wt 201 lb 8 oz (91.4 kg)  Wt Readings from Last 3 Encounters:  12/16/14 201 lb 8 oz (91.4 kg)  12/28/13 192 lb (87.091 kg)  10/08/13 200 lb 4 oz (90.833 kg)    Physical Exam  Constitutional: He appears well-developed and well-nourished. No distress.  HENT:  Head: Normocephalic and atraumatic.  Right Ear: Hearing, tympanic membrane, external ear and ear canal normal.  Left Ear: Hearing and external ear normal.  Mouth/Throat: Oropharynx is clear and moist. No oropharyngeal exudate.  Large hardened piece of ear wax attached to lateral wall of outer external canal, removed with plastic curette, with raw erythematous external canal under wax. Pt tolerated well.  Eyes: Conjunctivae and EOM are normal. Pupils are equal, round, and reactive to light. No scleral icterus.  Neck: Normal range of motion. Neck supple.  Lymphadenopathy:    He has no cervical  adenopathy.  Nursing note and vitals reviewed.  Results for orders placed or performed in visit on 01/20/14  Lipid panel  Result Value Ref Range   Cholesterol 211 (H) 0 - 200 mg/dL   Triglycerides 48.0 0.0 - 149.0 mg/dL   HDL 59.90 >39.00 mg/dL   VLDL 9.6 0.0 - 40.0 mg/dL   LDL Cholesterol 142 (H) 0 - 99 mg/dL   Total CHOL/HDL Ratio 4       Assessment & Plan:   Problem List Items Addressed This Visit    Cerumen impaction - Primary    Left cerumen impaction leading to irritant external otitis Disimpaction performed by myself today. Provided with preventative vosol HC drops with indications on when to fill.          Follow up plan: Return if symptoms worsen or fail to improve.

## 2014-12-23 ENCOUNTER — Encounter: Payer: Self-pay | Admitting: Family Medicine

## 2015-01-06 ENCOUNTER — Ambulatory Visit (INDEPENDENT_AMBULATORY_CARE_PROVIDER_SITE_OTHER): Payer: BLUE CROSS/BLUE SHIELD | Admitting: Family Medicine

## 2015-01-06 ENCOUNTER — Encounter: Payer: Self-pay | Admitting: Family Medicine

## 2015-01-06 VITALS — BP 126/82 | HR 62 | Temp 98.0°F | Ht 66.0 in | Wt 198.5 lb

## 2015-01-06 DIAGNOSIS — E785 Hyperlipidemia, unspecified: Secondary | ICD-10-CM

## 2015-01-06 DIAGNOSIS — Z0000 Encounter for general adult medical examination without abnormal findings: Secondary | ICD-10-CM

## 2015-01-06 DIAGNOSIS — L918 Other hypertrophic disorders of the skin: Secondary | ICD-10-CM

## 2015-01-06 NOTE — Progress Notes (Signed)
Pre visit review using our clinic review tool, if applicable. No additional management support is needed unless otherwise documented below in the visit note. 

## 2015-01-06 NOTE — Assessment & Plan Note (Signed)
The patient complains of symptomatic skin tag on the R upper back. It is irritated by clothing, jewelry and rubbing. Benign skin tag noted on the R upper back Skin tag snipped off using alcohol for cleansing and sterile iris scissors. Local anesthesia was used - <0.5cc 1% lidocaine with epi. No bleeding, no silver nitrate needed. Dressed with triple abx ointment. Pathognomonic lesion not sent for pathology.

## 2015-01-06 NOTE — Assessment & Plan Note (Signed)
Preventative protocols reviewed and updated unless pt declined. Discussed healthy diet and lifestyle.  

## 2015-01-06 NOTE — Patient Instructions (Addendum)
Return as needed or in 2 years for next physical Skin tag removed today. Good to see you today, call us with questions. Blood work today.

## 2015-01-06 NOTE — Assessment & Plan Note (Signed)
Check FLP today. 

## 2015-01-06 NOTE — Progress Notes (Signed)
BP 126/82 mmHg  Pulse 62  Temp(Src) 98 F (36.7 C) (Oral)  Ht 5\' 6"  (1.676 m)  Wt 198 lb 8 oz (90.039 kg)  BMI 32.05 kg/m2  SpO2 98%   CC: CPE  Subjective:    Patient ID: Todd Holt, male    DOB: [birth year], 49 y.o.   MRN: [id number]  HPI: Todd Holt is a 49 y.o. male presenting on 01/06/2015 for Annual Exam   Work and home going well. Denies any stressors, denies depression/anhedonia, sadness. Last year did have hospitalization for SI after stressful period.  Seat belt use discussed. No changing spots on skin.   Preventative: Fasting today. 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 Interim medical history since our last visit reviewed. Allergies and medications reviewed and updated. No current outpatient prescriptions on file prior to visit.   No current facility-administered medications on file prior to visit.    Review of Systems  Constitutional: Negative for fever, chills, activity change, appetite change, fatigue and unexpected weight change.  HENT: Negative for hearing loss.   Eyes: Negative for visual disturbance.  Respiratory: Negative for cough, chest tightness, shortness of breath and wheezing.   Cardiovascular: Negative for chest pain, palpitations and leg swelling.  Gastrointestinal: Negative for nausea, vomiting, abdominal pain, diarrhea, constipation, blood in stool and abdominal distention.  Genitourinary: Negative for hematuria and difficulty urinating.  Musculoskeletal: Negative for myalgias, arthralgias and neck pain.  Skin: Negative for rash.  Neurological: Negative for dizziness, seizures, syncope and headaches.  Hematological: Negative for adenopathy. Does not bruise/bleed easily.    Psychiatric/Behavioral: Negative for dysphoric mood. The patient is not nervous/anxious.    Per HPI unless specifically indicated above     Objective:    BP 126/82 mmHg  Pulse 62  Temp(Src) 98 F (36.7 C) (Oral)  Ht 5\' 6"  (1.676 m)  Wt 198 lb 8 oz (90.039 kg)  BMI 32.05 kg/m2  SpO2 98%  Wt Readings from Last 3 Encounters:  01/06/15 198 lb 8 oz (90.039 kg)  12/16/14 201 lb 8 oz (91.4 kg)  12/28/13 192 lb (87.091 kg)    Physical Exam  Constitutional: He is oriented to person, place, and time. He appears well-developed and well-nourished. No distress.  HENT:  Head: Normocephalic and atraumatic.  Right Ear: Hearing, tympanic membrane, external ear and ear canal normal.  Left Ear: Hearing, tympanic membrane, external ear and ear canal normal.  Nose: Nose normal.  Mouth/Throat: Uvula is midline, oropharynx is clear and moist and mucous membranes are normal. No oropharyngeal exudate, posterior oropharyngeal edema or posterior oropharyngeal erythema.  Eyes: Conjunctivae and EOM are normal. Pupils are equal, round, and reactive to light. No scleral icterus.  Neck: Normal range of motion. Neck supple. No thyromegaly present.  Cardiovascular: Normal rate, regular rhythm, normal heart sounds and intact distal pulses.   No murmur heard. Pulses:      Radial pulses are 2+ on the right side, and 2+ on the left side.  Pulmonary/Chest: Effort normal and breath sounds normal. No respiratory distress. He has no wheezes. He has no rales.  Abdominal: Soft. Bowel sounds are normal. He exhibits no distension and no mass. There is no tenderness. There is no rebound and no guarding.  Musculoskeletal: Normal range of motion.  He exhibits no edema.  Lymphadenopathy:    He has no cervical adenopathy.  Neurological: He is alert and oriented to person, place, and time.  CN grossly intact, station and gait intact  Skin: Skin is warm and dry. No rash noted.  Psychiatric: He has a normal mood and affect.  His behavior is normal. Judgment and thought content normal.  Nursing note and vitals reviewed.     Assessment & Plan:   Problem List Items Addressed This Visit    Skin tag    The patient complains of symptomatic skin tag on the R upper back. It is irritated by clothing, jewelry and rubbing. Benign skin tag noted on the R upper back Skin tag snipped off using alcohol for cleansing and sterile iris scissors. Local anesthesia was used - <0.5cc 1% lidocaine with epi. No bleeding, no silver nitrate needed. Dressed with triple abx ointment. Pathognomonic lesion not sent for pathology.      HLD (hyperlipidemia)    Check FLP today.      Relevant Orders   Lipid panel   Basic metabolic panel   Healthcare maintenance - Primary    Preventative protocols reviewed and updated unless pt declined. Discussed healthy diet and lifestyle.           Follow up plan: Return in about 2 years (around 01/06/2017), or as needed, for annual exam, prior fasting for blood work.

## 2015-01-07 LAB — BASIC METABOLIC PANEL
BUN: 16 mg/dL (ref 6–23)
CO2: 25 meq/L (ref 19–32)
CREATININE: 1.13 mg/dL (ref 0.40–1.50)
Calcium: 9.5 mg/dL (ref 8.4–10.5)
Chloride: 103 mEq/L (ref 96–112)
GFR: 89.45 mL/min (ref >60.00)
Glucose, Bld: 79 mg/dL (ref 70–99)
Potassium: 4.2 mEq/L (ref 3.5–5.1)
Sodium: 137 mEq/L (ref 135–145)

## 2015-01-07 LAB — LIPID PANEL
CHOLESTEROL: 217 mg/dL — AB (ref 0–200)
HDL: 65.8 mg/dL (ref >39.00)
LDL Cholesterol: 135 mg/dL — ABNORMAL HIGH (ref 0–99)
NonHDL: 151.2
Total CHOL/HDL Ratio: 3
Triglycerides: 80 mg/dL (ref 0.0–149.0)
VLDL: 16 mg/dL (ref 0.0–40.0)

## 2015-01-10 ENCOUNTER — Encounter: Payer: Self-pay | Admitting: *Deleted

## 2015-08-04 ENCOUNTER — Ambulatory Visit (INDEPENDENT_AMBULATORY_CARE_PROVIDER_SITE_OTHER): Payer: BLUE CROSS/BLUE SHIELD | Admitting: Family Medicine

## 2015-08-04 ENCOUNTER — Encounter: Payer: Self-pay | Admitting: Family Medicine

## 2015-08-04 VITALS — BP 126/88 | HR 64 | Temp 98.3°F | Wt 202.0 lb

## 2015-08-04 DIAGNOSIS — N529 Male erectile dysfunction, unspecified: Secondary | ICD-10-CM | POA: Diagnosis not present

## 2015-08-04 DIAGNOSIS — E785 Hyperlipidemia, unspecified: Secondary | ICD-10-CM | POA: Diagnosis not present

## 2015-08-04 MED ORDER — SILDENAFIL CITRATE 100 MG PO TABS: 50.0000 mg | ORAL_TABLET | Freq: Every day | ORAL | Status: DC | PRN

## 2015-08-04 NOTE — Progress Notes (Signed)
BP 126/88 mmHg  Pulse 64  Temp(Src) 98.3 F (36.8 C) (Oral)  Wt 202 lb (91.627 kg)   CC: ED  Subjective:    Patient ID: Todd Holt, male    DOB: [birth year], 49 y.o.   MRN: [id number]  HPI: Todd Holt is a 49 y.o. male presenting on 08/04/2015 for Erectile Dysfunction   ED - 6 mo h/o trouble maintaining erection. Normal am erections. Some financial stressors but overall doing well. Denies significant work stress. No known prostate issues.   HLD - off meds.   Denies chest pain or dyspnea with exercise.  Ex smoker. No rec drugs. No significant EtOH use.   Taking unisom to help sleep.   Relevant past medical, surgical, family and social history reviewed and updated as indicated. Interim medical history since our last visit reviewed. Allergies and medications reviewed and updated. No current outpatient prescriptions on file prior to visit.   No current facility-administered medications on file prior to visit.    Review of Systems Per HPI unless specifically indicated above     Objective:    BP 126/88 mmHg  Pulse 64  Temp(Src) 98.3 F (36.8 C) (Oral)  Wt 202 lb (91.627 kg)  Wt Readings from Last 3 Encounters:  08/04/15 202 lb (91.627 kg)  01/06/15 198 lb 8 oz (90.039 kg)  12/16/14 201 lb 8 oz (91.4 kg)    Physical Exam  Constitutional: He appears well-developed and well-nourished. No distress.  Cardiovascular: Normal rate, regular rhythm, normal heart sounds and intact distal pulses.   No murmur heard. Pulmonary/Chest: Effort normal and breath sounds normal. No respiratory distress. He has no wheezes. He has no rales.  Abdominal: Hernia confirmed negative in the right inguinal area and confirmed negative in the left inguinal area.  Genitourinary: Testes normal and penis normal. Right testis shows no mass, no swelling and no tenderness. Right testis is descended. Left testis shows no mass, no swelling and no tenderness. Left testis is descended.  Circumcised.  Lymphadenopathy:       Right: No inguinal adenopathy present.       Left: No inguinal adenopathy present.  Psychiatric: He has a normal mood and affect.  Nursing note and vitals reviewed.  Results for orders placed or performed in visit on 01/06/15  Lipid panel  Result Value Ref Range   Cholesterol 217 (H) 0 - 200 mg/dL   Triglycerides 80.0 0.0 - 149.0 mg/dL   HDL 65.80 >39.00 mg/dL   VLDL 16.0 0.0 - 40.0 mg/dL   LDL Cholesterol 135 (H) 0 - 99 mg/dL   Total CHOL/HDL Ratio 3    NonHDL 850.27   Basic metabolic panel  Result Value Ref Range   Sodium 137 135 - 145 mEq/L   Potassium 4.2 3.5 - 5.1 mEq/L   Chloride 103 96 - 112 mEq/L   CO2 25 19 - 32 mEq/L   Glucose, Bld 79 70 - 99 mg/dL   BUN 16 6 - 23 mg/dL   Creatinine, Ser 1.13 0.40 - 1.50 mg/dL   Calcium 9.5 8.4 - 10.5 mg/dL   GFR 89.45 >60.00 mL/min      Assessment & Plan:   Problem List Items Addressed This Visit    HLD (hyperlipidemia)    Discussed low chol diet to improve chol levels.      Relevant Medications   sildenafil (VIAGRA) 100 MG tablet   Erectile dysfunction - Primary    Discussed common causes (vascular etiology, med related, psychological etiology),  treatment options. Will start viagra 50-100mg  prn relations, discussed possible HA, priapism, need to avoid nitrates.  Coupon provided today. Update if no improvement noted. Pt agrees with plan.          Follow up plan: Return if symptoms worsen or fail to improve.

## 2015-08-04 NOTE — Patient Instructions (Addendum)
Let's try viagra sent to pharmacy start at 1/2 pill. Coupon provided today. We will continue to watch cholesterol levels yearly. Work on [employer] changes to lower cholesterol. Flu shot at work.  Erectile Dysfunction Erectile dysfunction is the inability to get or sustain a good enough erection to have sexual intercourse. Erectile dysfunction may involve:  Inability to get an erection.  Lack of enough hardness to allow penetration.  Loss of the erection before sex is finished.  Premature ejaculation. CAUSES  Certain drugs, such as:  Pain relievers.  Antihistamines.  Antidepressants.  Blood pressure medicines.  Water pills (diuretics).  Ulcer medicines.  Muscle relaxants.  Illegal drugs.  Excessive drinking.  Psychological causes, such as:  Anxiety.  Depression.  Sadness.  Exhaustion.  Performance fear.  Stress.  Physical causes, such as:  Artery problems. This may include diabetes, smoking, liver disease, or atherosclerosis.  High blood pressure.  Hormonal problems, such as low testosterone.  Obesity.  Nerve problems. This may include back or pelvic injuries, diabetes mellitus, multiple sclerosis, or Parkinson disease. SYMPTOMS  Inability to get an erection.  Lack of enough hardness to allow penetration.  Loss of the erection before sex is finished.  Premature ejaculation.  Normal erections at some times, but with frequent unsatisfactory episodes.  Orgasms that are not satisfactory in sensation or frequency.  Low sexual satisfaction in either partner because of erection problems.  A curved penis occurring with erection. The curve may cause pain or may be too curved to allow for intercourse.  Never having nighttime erections. DIAGNOSIS Your caregiver can often diagnose this condition by:  Performing a physical exam to find other diseases or specific problems with the penis.  Asking you detailed questions about the  problem.  Performing blood tests to check for diabetes mellitus or to measure hormone levels.  Performing urine tests to find other underlying health conditions.  Performing an ultrasound exam to check for scarring.  Performing a test to check blood flow to the penis.  Doing a sleep study at home to measure nighttime erections. TREATMENT   You may be prescribed medicines by mouth.  You may be given medicine injections into the penis.  You may be prescribed a vacuum pump with a ring.  Penile implant surgery may be performed. You may receive:  An inflatable implant.  A semirigid implant.  Blood vessel surgery may be performed. HOME CARE INSTRUCTIONS  If you are prescribed oral medicine, you should take the medicine as prescribed. Do not increase the dosage without first discussing it with your physician.  If you are using self-injections, be careful to avoid any veins that are on the surface of the penis. Apply pressure to the injection site for 5 minutes.  If you are using a vacuum pump, make sure you have read the instructions before using it. Discuss any questions with your physician before taking the pump home. SEEK MEDICAL CARE IF:  You experience pain that is not responsive to the pain medicine you have been prescribed.  You experience nausea or vomiting. SEEK IMMEDIATE MEDICAL CARE IF:   When taking oral or injectable medications, you experience an erection that lasts longer than 4 hours. If your physician is unavailable, go to the nearest emergency room for evaluation. An erection that lasts much longer than 4 hours can result in permanent damage to your penis.  You have pain that is severe.  You develop redness, severe pain, or severe swelling of your penis.  You have redness spreading  up into your groin or lower abdomen.  You are unable to pass your urine. Document Released: 11/02/2000 Document Revised: 07/08/2013 Document Reviewed: 04/09/2013 [facility]  Patient Information 2015 [place], [place]. This information is not intended to replace advice given to you by your health care provider. Make sure you discuss any questions you have with your health care provider.

## 2015-08-04 NOTE — Assessment & Plan Note (Signed)
Discussed common causes (vascular etiology, med related, psychological etiology), treatment options. Will start viagra 50-100mg  prn relations, discussed possible HA, priapism, need to avoid nitrates.  Coupon provided today. Update if no improvement noted. Pt agrees with plan.

## 2015-08-04 NOTE — Progress Notes (Signed)
Pre visit review using our clinic review tool, if applicable. No additional management support is needed unless otherwise documented below in the visit note. 

## 2015-08-04 NOTE — Assessment & Plan Note (Signed)
Discussed low chol diet to improve chol levels.

## 2016-09-10 ENCOUNTER — Telehealth: Payer: Self-pay

## 2016-09-10 ENCOUNTER — Ambulatory Visit (INDEPENDENT_AMBULATORY_CARE_PROVIDER_SITE_OTHER): Payer: Self-pay | Admitting: Family Medicine

## 2016-09-10 ENCOUNTER — Encounter: Payer: Self-pay | Admitting: Family Medicine

## 2016-09-10 VITALS — BP 116/86 | HR 68 | Temp 98.2°F | Wt 192.5 lb

## 2016-09-10 DIAGNOSIS — Z23 Encounter for immunization: Secondary | ICD-10-CM

## 2016-09-10 DIAGNOSIS — M79604 Pain in right leg: Secondary | ICD-10-CM

## 2016-09-10 MED ORDER — PREDNISONE 20 MG PO TABS: ORAL_TABLET | ORAL | 0 refills | Status: DC

## 2016-09-10 NOTE — Patient Instructions (Signed)
Flu shot today Possible sciatica - treat with prednisone course and we will refer you to physical therapy Let us know if not improving with treatment.   Sciatica Sciatica is pain, weakness, numbness, or tingling along the path of the sciatic nerve. The nerve starts in the lower back and runs down the back of each leg. The nerve controls the muscles in the lower leg and in the back of the knee, while also providing sensation to the back of the thigh, lower leg, and the sole of your foot. Sciatica is a symptom of another medical condition. For instance, nerve damage or certain conditions, such as a herniated disk or bone spur on the spine, pinch or put pressure on the sciatic nerve. This causes the pain, weakness, or other sensations normally associated with sciatica. Generally, sciatica only affects one side of the body. CAUSES   Herniated or slipped disc.  Degenerative disk disease.  A pain disorder involving the narrow muscle in the buttocks (piriformis syndrome).  Pelvic injury or fracture.  Pregnancy.  Tumor (rare). SYMPTOMS  Symptoms can vary from mild to very severe. The symptoms usually travel from the low back to the buttocks and down the back of the leg. Symptoms can include:  Mild tingling or dull aches in the lower back, leg, or hip.  Numbness in the back of the calf or sole of the foot.  Burning sensations in the lower back, leg, or hip.  Sharp pains in the lower back, leg, or hip.  Leg weakness.  Severe back pain inhibiting movement. These symptoms may get worse with coughing, sneezing, laughing, or prolonged sitting or standing. Also, being overweight may worsen symptoms. DIAGNOSIS  Your caregiver will perform a physical exam to look for common symptoms of sciatica. He or she may ask you to do certain movements or activities that would trigger sciatic nerve pain. Other tests may be performed to find the cause of the sciatica. These may include:  Blood  tests.  X-rays.  Imaging tests, such as an MRI or CT scan. TREATMENT  Treatment is directed at the cause of the sciatic pain. Sometimes, treatment is not necessary and the pain and discomfort goes away on its own. If treatment is needed, your caregiver may suggest:  Over-the-counter medicines to relieve pain.  Prescription medicines, such as anti-inflammatory medicine, muscle relaxants, or narcotics.  Applying heat or ice to the painful area.  Steroid injections to lessen pain, irritation, and inflammation around the nerve.  Reducing activity during periods of pain.  Exercising and stretching to strengthen your abdomen and improve flexibility of your spine. Your caregiver may suggest losing weight if the extra weight makes the back pain worse.  Physical therapy.  Surgery to eliminate what is pressing or pinching the nerve, such as a bone spur or part of a herniated disk. HOME CARE INSTRUCTIONS   Only take over-the-counter or prescription medicines for pain or discomfort as directed by your caregiver.  Apply ice to the affected area for 20 minutes, 3-4 times a day for the first 48-72 hours. Then try heat in the same way.  Exercise, stretch, or perform your usual activities if these do not aggravate your pain.  Attend physical therapy sessions as directed by your caregiver.  Keep all follow-up appointments as directed by your caregiver.  Do not wear high heels or shoes that do not provide proper support.  Check your mattress to see if it is too soft. A firm mattress may lessen your pain and  discomfort. SEEK IMMEDIATE MEDICAL CARE IF:   You lose control of your bowel or bladder (incontinence).  You have increasing weakness in the lower back, pelvis, buttocks, or legs.  You have redness or swelling of your back.  You have a burning sensation when you urinate.  You have pain that gets worse when you lie down or awakens you at night.  Your pain is worse than you have  experienced in the past.  Your pain is lasting longer than 4 weeks.  You are suddenly losing weight without reason. MAKE SURE YOU:  Understand these instructions.  Will watch your condition.  Will get help right away if you are not doing well or get worse.   This information is not intended to replace advice given to you by your health care provider. Make sure you discuss any questions you have with your health care provider.   Document Released: 10/30/2001 Document Revised: 07/27/2015 Document Reviewed: 03/16/2012 Elsevier Interactive Patient Education [employer].

## 2016-09-10 NOTE — Assessment & Plan Note (Addendum)
Anticipate sciatica, discussed work position may be exacerbating pain. No signs of DVT, bone pain. Treat with prednisone taper, refer to PT. Pt agrees with plan. Check pulses next visit.

## 2016-09-10 NOTE — Progress Notes (Signed)
Pre visit review using our clinic review tool, if applicable. No additional management support is needed unless otherwise documented below in the visit note. 

## 2016-09-10 NOTE — Telephone Encounter (Signed)
Pt walked in requesting to be seen; for 2 months on and off pt has had rt leg pain; no known injury; pt worked today and came by office; pain level now 6-7. Dr Darnell Level said could work in today at [employer] or schedule 10/12/16. Pt request appt today. FYI to Dr Darnell Level.

## 2016-09-10 NOTE — Progress Notes (Signed)
BP 116/86   Pulse 68   Temp 98.2 F (36.8 C) (Oral)   Wt 192 lb 8 oz (87.3 kg)   BMI 31.07 kg/m    CC: R leg pain Subjective:    Patient ID: Todd Holt, male    DOB: 1966-04-10, 50 y.o.   MRN: [id number]  HPI: Todd Holt is a 50 y.o. male presenting on 09/10/2016 for Leg Pain (right; aching pain; no trauma)   Several month h/o R leg aching - points to posterior thigh. Worsening over last 2 months. Denies back pain. Some shooting pain down leg. + numbness and weakness of leg. Describes "toothache" like pain from buttock down posterior leg to posterior knee. Taking aleve didn't really help. Worse at work but also present at home at night time.   No fevers/chills, dizziness, bowel/bladder accidents.   No redness, warmth, swelling.  Denies inciting trauma/injury or falls.   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   Pain may have started several years ago after lifting heavy cooler at work.  Relevant past medical, surgical, family and social history reviewed and updated as indicated. Interim medical history since our last visit reviewed. Allergies and medications reviewed and updated. Current Outpatient Prescriptions on File Prior to Visit  Medication Sig  . sildenafil (VIAGRA) 100 MG tablet Take 0.5-1 tablets (50-100 mg total) by mouth daily as needed for erectile dysfunction. (Patient not taking: Reported on 09/10/2016)   No current facility-administered medications on file prior to visit.     Review of Systems Per HPI unless specifically indicated in ROS section     Objective:    BP 116/86   Pulse 68   Temp 98.2 F (36.8 C) (Oral)   Wt 192 lb 8 oz (87.3 kg)   BMI 31.07 kg/m   Wt Readings from Last 3 Encounters:  09/10/16 192 lb 8 oz (87.3 kg)  08/04/15 202 lb (91.6 kg)  01/06/15 198 lb 8 oz (90 kg)    Physical Exam  Constitutional: He is oriented to person, place, and time. He appears well-developed and well-nourished. No  distress.  Musculoskeletal: Normal range of motion. He exhibits no edema.  No leg or calf swelling  FROM flexion, extension and lateral flexion at spine  No pain midline spine No paraspinous mm tenderness Neg SLR bilaterally. No pain with int/ext rotation at hip. Neg FABER. No pain at SIJ, GTB or sciatic notch bilaterally.  No point tenderness to palpation along R femur No groin pain noted Some weakness of right hip abduction  Neurological: He is alert and oriented to person, place, and time.  5/5 BLE strength  Skin: Skin is warm and dry. No rash noted.  Psychiatric: He has a normal mood and affect.  Nursing note and vitals reviewed.     Assessment & Plan:   Problem List Items Addressed This Visit    Leg pain, posterior, right - Primary    Anticipate sciatica, discussed work position may be exacerbating pain. No signs of DVT, bone pain. Treat with prednisone taper, refer to PT. Pt agrees with plan. Check pulses next visit.       Relevant Orders   Ambulatory referral to Physical Therapy    Other Visit Diagnoses    Need for influenza vaccination       Relevant Orders   Flu Vaccine QUAD 36+ mos PF IM (Fluarix & Fluzone Quad PF) (Completed)       Follow up plan: Return  if symptoms worsen or fail to improve.  Ria Bush, MD

## 2016-09-11 NOTE — Telephone Encounter (Signed)
Seen today. 

## 2017-05-14 DIAGNOSIS — L235 Allergic contact dermatitis due to other chemical products: Secondary | ICD-10-CM | POA: Diagnosis not present

## 2017-08-29 DIAGNOSIS — Z23 Encounter for immunization: Secondary | ICD-10-CM | POA: Diagnosis not present

## 2017-09-04 DIAGNOSIS — Z113 Encounter for screening for infections with a predominantly sexual mode of transmission: Secondary | ICD-10-CM | POA: Diagnosis not present

## 2017-11-20 ENCOUNTER — Ambulatory Visit (HOSPITAL_COMMUNITY)
Admission: EM | Admit: 2017-11-20 | Discharge: 2017-11-20 | Disposition: A | Discharge status: Home / Self Care | Payer: BLUE CROSS/BLUE SHIELD | Attending: Family Medicine | Admitting: Family Medicine

## 2017-11-20 ENCOUNTER — Other Ambulatory Visit: Payer: Self-pay

## 2017-11-20 ENCOUNTER — Encounter (HOSPITAL_COMMUNITY): Payer: Self-pay | Admitting: Emergency Medicine

## 2017-11-20 DIAGNOSIS — R2232 Localized swelling, mass and lump, left upper limb: Secondary | ICD-10-CM

## 2017-11-20 DIAGNOSIS — M654 Radial styloid tenosynovitis [de Quervain]: Secondary | ICD-10-CM

## 2017-11-20 NOTE — Discharge Instructions (Signed)
Try taking ibuprofen 800mg  (4 over the counter tablets) with food every 8 hours for the next 3-4 days. Wear the splint that we gave you for the next 1-2 weeks. If not seeing improvement within 1-2 weeks please follow up with your doctor.

## 2017-11-20 NOTE — ED Triage Notes (Signed)
Patient says he donated plasma last Thursday.  Patient says left hand had an iv while donating plasma.  During the process, patient noticed left hand swelling and having pain. Left hand is still swollen and painful to move
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
## 2018-08-29 DIAGNOSIS — Z23 Encounter for immunization: Secondary | ICD-10-CM | POA: Diagnosis not present

## 2019-02-02 ENCOUNTER — Ambulatory Visit: Payer: BLUE CROSS/BLUE SHIELD | Admitting: Family Medicine

## 2019-02-02 ENCOUNTER — Other Ambulatory Visit: Payer: Self-pay

## 2019-02-02 ENCOUNTER — Encounter: Payer: Self-pay | Admitting: Family Medicine

## 2019-02-02 VITALS — BP 144/100 | HR 72 | Temp 98.0°F | Ht 66.0 in | Wt 188.2 lb

## 2019-02-02 DIAGNOSIS — R030 Elevated blood-pressure reading, without diagnosis of hypertension: Secondary | ICD-10-CM | POA: Diagnosis not present

## 2019-02-02 DIAGNOSIS — Z113 Encounter for screening for infections with a predominantly sexual mode of transmission: Secondary | ICD-10-CM

## 2019-02-02 DIAGNOSIS — I10 Essential (primary) hypertension: Secondary | ICD-10-CM | POA: Insufficient documentation

## 2019-02-02 DIAGNOSIS — N4889 Other specified disorders of penis: Secondary | ICD-10-CM | POA: Diagnosis not present

## 2019-02-02 DIAGNOSIS — Z1211 Encounter for screening for malignant neoplasm of colon: Secondary | ICD-10-CM | POA: Diagnosis not present

## 2019-02-02 NOTE — Progress Notes (Signed)
BP (!) 144/100 (BP Location: Right Arm, Patient Position: Sitting, Cuff Size: Normal)   Pulse 72   Temp 98 F (36.7 C) (Oral)   Ht 5\' 6"  (1.676 m)   Wt 188 lb 4 oz (85.4 kg)   SpO2 97%   BMI 30.38 kg/m   Elevated on repeat testing  CC: bumps on penis Subjective:    Patient ID: Todd Holt, male    DOB: [birth year], 53 y.o.   MRN: [id number]  HPI: Todd Holt is a 53 y.o. male presenting on 02/02/2019 for Skin Lesions (C/o bumps on shaft of penis this morning. Denies any pain. )    This morning noticed bumps on penis - mildly itchy.   He has recently started using GF's soap - unsure type.   No other new lotions, detergents, or shampoos.  No other rash.  No fevers/chills, dysuria, urethral discharge, groin LAD  Currently going through divorce.  3-4 partners in the last year.  New GF for 3 months.  Not 100% with protection.  H/o treated gonorrhea remotely.   BP elevated today.   Colon cancer screening - discussed, would like colonoscopy referral.      Relevant past medical, surgical, family and social history reviewed and updated as indicated. Interim medical history since our last visit reviewed. Allergies and medications reviewed and updated. Outpatient Medications Prior to Visit  Medication Sig Dispense Refill  . predniSONE (DELTASONE) 20 MG tablet Take two tablets daily for 3 days followed by one tablet daily for 4 days 10 tablet 0  . sildenafil (VIAGRA) 100 MG tablet Take 0.5-1 tablets (50-100 mg total) by mouth daily as needed for erectile dysfunction. (Patient not taking: Reported on 09/10/2016) 9 tablet 6   No facility-administered medications prior to visit.      Per HPI unless specifically indicated in ROS section below Review of Systems Objective:    BP (!) 144/100 (BP Location: Right Arm, Patient Position: Sitting, Cuff Size: Normal)   Pulse 72   Temp 98 F (36.7 C) (Oral)   Ht 5\' 6"  (1.676 m)   Wt 188 lb 4 oz (85.4 kg)   SpO2 97%    BMI 30.38 kg/m   Wt Readings from Last 3 Encounters:  02/02/19 188 lb 4 oz (85.4 kg)  09/10/16 192 lb 8 oz (87.3 kg)  08/04/15 202 lb (91.6 kg)    Physical Exam Vitals signs and nursing note reviewed.  Constitutional:      General: He is not in acute distress.    Appearance: Normal appearance. He is not ill-appearing.  Cardiovascular:     Rate and Rhythm: Normal rate and regular rhythm.     Pulses: Normal pulses.     Heart sounds: Normal heart sounds. No murmur.  Pulmonary:     Effort: Pulmonary effort is normal. No respiratory distress.     Breath sounds: Normal breath sounds. No wheezing, rhonchi or rales.  Genitourinary:    Pubic Area: No rash.      Penis: Circumcised. Lesions present.      Scrotum/Testes: Normal.     Comments: Cluster of 3 non-umbilicated pearly penile papules R shaft of penis without erythema. No vesicles.  Lymphadenopathy:     Lower Body: No right inguinal adenopathy. No left inguinal adenopathy.  Neurological:     Mental Status: He is alert.        Assessment & Plan:  STD screen today.  Discussed colon cancer screening options - desires referral for colonoscopy Problem  List Items Addressed This Visit    Penile papules - Primary    Small papules of unclear etiology. ?PPPP vs warts vs other. Not consistent with herpes.  Check STD screen including syphilis.       Elevated blood pressure reading in office without diagnosis of hypertension    Newly noted today. Denies symptoms.  Reviewed home monitoring, provided DASH diet, reassess at CPE. Update me sooner if home readings staying elevated. Discussed possibly starting medication.        Other Visit Diagnoses    Special screening for malignant neoplasms, colon       Relevant Orders   Ambulatory referral to Gastroenterology   Screen for STD (sexually transmitted disease)       Relevant Orders   C. trachomatis/N. gonorrhoeae RNA   HIV Antibody (routine testing w rflx)   RPR       No orders  of the defined types were placed in this encounter.  Orders Placed This Encounter  Procedures  . C. trachomatis/N. gonorrhoeae RNA  . HIV Antibody (routine testing w rflx)  . RPR  . Ambulatory referral to Gastroenterology    Referral Priority:   Routine    Referral Type:   Consultation    Referral Reason:   Specialty Services Required    Number of Visits Requested:   1    Follow up plan: No follow-ups on file.  Ria Bush, MD

## 2019-02-02 NOTE — Assessment & Plan Note (Signed)
Small papules of unclear etiology. ?PPPP vs warts vs other. Not consistent with herpes.  Check STD screen including syphilis.

## 2019-02-02 NOTE — Assessment & Plan Note (Signed)
Newly noted today. Denies symptoms.  Reviewed home monitoring, provided DASH diet, reassess at CPE. Update me sooner if home readings staying elevated. Discussed possibly starting medication.

## 2019-02-02 NOTE — Patient Instructions (Addendum)
Possible warts, possible benign penile papules or benign milia lesions.  STD screen today - we will be in touch with results.  We will refer you for colon cancer screening  Keep physical appointment.  Start monitoring blood pressures at home (or local pharmacy) goal for you is <140/90, but the lower the better. Schedule appointment if consistently elevated. Limit salt/sodium in the diet to 2000mg  daily.  Eat a diet high in fruits/vegetables and whole grains.  Look into mediterranean and DASH diet. Goal activity is 157min/wk of moderate intensity exercise.  This can be split into 30 minute chunks.  If you are not at this level, you can start with smaller 10-15 min increments and slowly build up activity. Look at [place].org for more resources    DASH Eating Plan DASH stands for "Dietary Approaches to Stop Hypertension." The DASH eating plan is a healthy eating plan that has been shown to reduce high blood pressure (hypertension). It may also reduce your risk for type 2 diabetes, heart disease, and stroke. The DASH eating plan may also help with weight loss. What are tips for following this plan?  General guidelines  Avoid eating more than 2,300 mg (milligrams) of salt (sodium) a day. If you have hypertension, you may need to reduce your sodium intake to 1,500 mg a day.  Limit alcohol intake to no more than 1 drink a day for nonpregnant women and 2 drinks a day for men. One drink equals 12 oz of beer, 5 oz of wine, or 1 oz of hard liquor.  Work with your health care provider to maintain a healthy body weight or to lose weight. Ask what an ideal weight is for you.  Get at least 30 minutes of exercise that causes your heart to beat faster (aerobic exercise) most days of the week. Activities may include walking, swimming, or biking.  Work with your health care provider or diet and nutrition specialist (dietitian) to adjust your eating plan to your individual calorie needs. Reading food  labels   Check food labels for the amount of sodium per serving. Choose foods with less than 5 percent of the Daily Value of sodium. Generally, foods with less than 300 mg of sodium per serving fit into this eating plan.  To find whole grains, look for the word "whole" as the first word in the ingredient list. Shopping  Buy products labeled as "low-sodium" or "no salt added."  Buy fresh foods. Avoid canned foods and premade or frozen meals. Cooking  Avoid adding salt when cooking. Use salt-free seasonings or herbs instead of table salt or sea salt. Check with your health care provider or pharmacist before using salt substitutes.  Do not fry foods. Cook foods using healthy methods such as baking, boiling, grilling, and broiling instead.  Cook with heart-healthy oils, such as olive, canola, soybean, or sunflower oil. Meal planning  Eat a balanced diet that includes: ? 5 or more servings of fruits and vegetables each day. At each meal, try to fill half of your plate with fruits and vegetables. ? Up to 6-8 servings of whole grains each day. ? Less than 6 oz of lean meat, poultry, or fish each day. A 3-oz serving of meat is about the same size as a deck of cards. One egg equals 1 oz. ? 2 servings of low-fat dairy each day. ? A serving of nuts, seeds, or beans 5 times each week. ? Heart-healthy fats. Healthy fats called Omega-3 fatty acids are found in foods  such as flaxseeds and coldwater fish, like sardines, salmon, and mackerel.  Limit how much you eat of the following: ? Canned or prepackaged foods. ? Food that is high in trans fat, such as fried foods. ? Food that is high in saturated fat, such as fatty meat. ? Sweets, desserts, sugary drinks, and other foods with added sugar. ? Full-fat dairy products.  Do not salt foods before eating.  Try to eat at least 2 vegetarian meals each week.  Eat more home-cooked food and less restaurant, buffet, and fast food.  When eating at a  restaurant, ask that your food be prepared with less salt or no salt, if possible. What foods are recommended? The items listed may not be a complete list. Talk with your dietitian about what dietary choices are best for you. Grains Whole-grain or whole-wheat bread. Whole-grain or whole-wheat pasta. Brown rice. [place]row. Bulgur. Whole-grain and low-sodium cereals. Pita bread. Low-fat, low-sodium crackers. Whole-wheat flour tortillas. Vegetables Fresh or frozen vegetables (raw, steamed, roasted, or grilled). Low-sodium or reduced-sodium tomato and vegetable juice. Low-sodium or reduced-sodium tomato sauce and tomato paste. Low-sodium or reduced-sodium canned vegetables. Fruits All fresh, dried, or frozen fruit. Canned fruit in natural juice (without added sugar). Meat and other protein foods Skinless chicken or [place]. Ground chicken or [place]. Pork with fat trimmed off. Fish and seafood. Egg whites. Dried beans, peas, or lentils. Unsalted nuts, nut butters, and seeds. Unsalted canned beans. Lean cuts of beef with fat trimmed off. Low-sodium, lean deli meat. Dairy Low-fat (1%) or fat-free (skim) milk. Fat-free, low-fat, or reduced-fat cheeses. Nonfat, low-sodium ricotta or cottage cheese. Low-fat or nonfat yogurt. Low-fat, low-sodium cheese. Fats and oils Soft margarine without trans fats. Vegetable oil. Low-fat, reduced-fat, or light mayonnaise and salad dressings (reduced-sodium). Canola, safflower, olive, soybean, and sunflower oils. Avocado. Seasoning and other foods Herbs. Spices. Seasoning mixes without salt. Unsalted popcorn and pretzels. Fat-free sweets. What foods are not recommended? The items listed may not be a complete list. Talk with your dietitian about what dietary choices are best for you. Grains Baked goods made with fat, such as croissants, muffins, or some breads. Dry pasta or rice meal packs. Vegetables Creamed or fried vegetables. Vegetables in a cheese sauce.  Regular canned vegetables (not low-sodium or reduced-sodium). Regular canned tomato sauce and paste (not low-sodium or reduced-sodium). Regular tomato and vegetable juice (not low-sodium or reduced-sodium). Angie Fava. Olives. Fruits Canned fruit in a light or heavy syrup. Fried fruit. Fruit in cream or butter sauce. Meat and other protein foods Fatty cuts of meat. Ribs. Fried meat. Berniece Salines. Sausage. Bologna and other processed lunch meats. Salami. Fatback. Hotdogs. Bratwurst. Salted nuts and seeds. Canned beans with added salt. Canned or smoked fish. Whole eggs or egg yolks. Chicken or [place] with skin. Dairy Whole or 2% milk, cream, and half-and-half. Whole or full-fat cream cheese. Whole-fat or sweetened yogurt. Full-fat cheese. Nondairy creamers. Whipped toppings. Processed cheese and cheese spreads. Fats and oils Butter. Stick margarine. Lard. Shortening. Ghee. Bacon fat. Tropical oils, such as coconut, palm kernel, or palm oil. Seasoning and other foods Salted popcorn and pretzels. Onion salt, garlic salt, seasoned salt, table salt, and sea salt. Worcestershire sauce. Tartar sauce. Barbecue sauce. Teriyaki sauce. Soy sauce, including reduced-sodium. Steak sauce. Canned and packaged gravies. Fish sauce. Oyster sauce. Cocktail sauce. Horseradish that you find on the shelf. Ketchup. Mustard. Meat flavorings and tenderizers. Bouillon cubes. Hot sauce and Tabasco sauce. Premade or packaged marinades. Premade or packaged taco seasonings. Relishes. Regular salad  dressings. Where to find more information:  National Heart, Lung, and [place]: https://wilson-eaton.com/  American Heart Association: www.heart.org Summary  The DASH eating plan is a healthy eating plan that has been shown to reduce high blood pressure (hypertension). It may also reduce your risk for type 2 diabetes, heart disease, and stroke.  With the DASH eating plan, you should limit salt (sodium) intake to 2,300 mg a day. If you have  hypertension, you may need to reduce your sodium intake to 1,500 mg a day.  When on the DASH eating plan, aim to eat more fresh fruits and vegetables, whole grains, lean proteins, low-fat dairy, and heart-healthy fats.  Work with your health care provider or diet and nutrition specialist (dietitian) to adjust your eating plan to your individual calorie needs. This information is not intended to replace advice given to you by your health care provider. Make sure you discuss any questions you have with your health care provider. Document Released: 10/25/2011 Document Revised: 10/29/2016 Document Reviewed: 10/29/2016 Elsevier Interactive Patient Education  2019 [employer].

## 2019-02-03 ENCOUNTER — Other Ambulatory Visit: Payer: BLUE CROSS/BLUE SHIELD

## 2019-02-03 LAB — C. TRACHOMATIS/N. GONORRHOEAE RNA
C. trachomatis RNA, TMA: NOT DETECTED
N. GONORRHOEAE RNA, TMA: NOT DETECTED

## 2019-02-04 NOTE — Addendum Note (Signed)
Addended by: Ellamae Sia on: 02/04/2019 12:42 PM   Modules accepted: Orders

## 2019-03-18 ENCOUNTER — Other Ambulatory Visit: Payer: Self-pay

## 2019-03-24 ENCOUNTER — Encounter: Payer: Self-pay | Admitting: Family Medicine

## 2019-04-07 ENCOUNTER — Encounter: Payer: Self-pay | Admitting: Gastroenterology

## 2019-04-07 NOTE — Telephone Encounter (Signed)
Error

## 2019-04-15 ENCOUNTER — Ambulatory Visit: Payer: BLUE CROSS/BLUE SHIELD | Admitting: *Deleted

## 2019-04-15 ENCOUNTER — Encounter: Payer: Self-pay | Admitting: Gastroenterology

## 2019-04-15 ENCOUNTER — Other Ambulatory Visit: Payer: Self-pay

## 2019-04-15 VITALS — Ht 66.0 in | Wt 175.0 lb

## 2019-04-15 DIAGNOSIS — Z1211 Encounter for screening for malignant neoplasm of colon: Secondary | ICD-10-CM

## 2019-04-15 MED ORDER — NA SULFATE-K SULFATE-MG SULF 17.5-3.13-1.6 GM/177ML PO SOLN: 1.0000 | Freq: Once | ORAL | 0 refills | Status: AC

## 2019-04-15 NOTE — Progress Notes (Signed)
No egg or soy allergy known to patient  No issues with past sedation with any surgeries  or procedures, no intubation problems  No diet pills per patient No home 02 use per patient  No blood thinners per patient  Pt denies issues with constipation  No A fib or A flutter  EMMI video sent to pt's e mail   Pt verified name, DOB, address and insurance during PV today. Pt mailed instruction packet to included paper to complete and mail back to [facility] with addressed and stamped envelope, Emmi video, copy of consent form to read and not return, and instructions. Suprep $15  coupon mailed in packet. PV completed over the phone. Pt encouraged to call with questions or issues

## 2019-04-20 HISTORY — PX: COLONOSCOPY: SHX174

## 2019-04-24 ENCOUNTER — Telehealth: Payer: Self-pay | Admitting: *Deleted

## 2019-04-24 NOTE — Telephone Encounter (Signed)
Covid-19 screening questions  Have you traveled in the last 14 days? No. If yes where?  Do you now or have you had a fever in the last 14 days? No.  Do you have any respiratory symptoms of shortness of breath or cough now or in the last 14 days? No.  Do you have any family members or close contacts with diagnosed or suspected Covid-19 in the past 14 days? No.  Have you been tested for Covid-19 and found to be positive? No.       

## 2019-04-27 ENCOUNTER — Encounter: Payer: Self-pay | Admitting: Gastroenterology

## 2019-04-27 ENCOUNTER — Other Ambulatory Visit: Payer: Self-pay

## 2019-04-27 ENCOUNTER — Ambulatory Visit (AMBULATORY_SURGERY_CENTER): Payer: BC Managed Care – PPO | Admitting: Gastroenterology

## 2019-04-27 VITALS — BP 140/92 | HR 66 | Temp 98.3°F | Resp 14 | Ht 66.0 in | Wt 188.0 lb

## 2019-04-27 DIAGNOSIS — Z1211 Encounter for screening for malignant neoplasm of colon: Secondary | ICD-10-CM

## 2019-04-27 DIAGNOSIS — D124 Benign neoplasm of descending colon: Secondary | ICD-10-CM | POA: Diagnosis not present

## 2019-04-27 DIAGNOSIS — D122 Benign neoplasm of ascending colon: Secondary | ICD-10-CM | POA: Diagnosis not present

## 2019-04-27 MED ORDER — SODIUM CHLORIDE 0.9 % IV SOLN: 500.0000 mL | Freq: Once | INTRAVENOUS | Status: DC

## 2019-04-27 NOTE — Progress Notes (Signed)
Called to room to assist during endoscopic procedure.  Patient ID and intended procedure confirmed with present staff. Received instructions for my participation in the procedure from the performing physician.  

## 2019-04-27 NOTE — Op Note (Signed)
[place] Patient Name: Asir Bingley Procedure Date: 04/27/2019 10:36 AM MRN: [id number] Endoscopist: Mallie Mussel L. Loletha Carrow , MD Age: 53 Referring MD:  Date of Birth: March 07, 1966 Gender: Male Account #: [id number] Procedure:                Colonoscopy Indications:              Screening for colorectal malignant neoplasm, This                            is the patient's first colonoscopy Medicines:                Monitored Anesthesia Care Procedure:                Pre-Anesthesia Assessment:                           - Prior to the procedure, a History and Physical                            was performed, and patient medications and                            allergies were reviewed. The patient's tolerance of                            previous anesthesia was also reviewed. The risks                            and benefits of the procedure and the sedation                            options and risks were discussed with the patient.                            All questions were answered, and informed consent                            was obtained. Prior Anticoagulants: The patient has                            taken no previous anticoagulant or antiplatelet                            agents. ASA Grade Assessment: II - A patient with                            mild systemic disease. After reviewing the risks                            and benefits, the patient was deemed in                            satisfactory condition to undergo the procedure.  After obtaining informed consent, the colonoscope                            was passed under direct vision. Throughout the                            procedure, the patient's blood pressure, pulse, and                            oxygen saturations were monitored continuously. The                            Colonoscope was introduced through the anus and                            advanced to the the cecum,  identified by                            appendiceal orifice and ileocecal valve. The                            colonoscopy was performed without difficulty. The                            patient tolerated the procedure well. The quality                            of the bowel preparation was good. The ileocecal                            valve, appendiceal orifice, and rectum were                            photographed. Scope In: 10:51:00 AM Scope Out: 11:06:40 AM Scope Withdrawal Time: 0 hours 11 minutes 26 seconds  Total Procedure Duration: 0 hours 15 minutes 40 seconds  Findings:                 The perianal and digital rectal examinations were                            normal.                           A diminutive polyp was found in the ascending                            colon. The polyp was sessile. The polyp was removed                            with a cold snare. Resection and retrieval were                            complete.  A diminutive polyp was found in the descending                            colon. The polyp was sessile. The polyp was removed                            with a cold snare. Resection and retrieval were                            complete.                           The exam was otherwise without abnormality on                            direct and retroflexion views. Complications:            No immediate complications. Estimated Blood Loss:     Estimated blood loss was minimal. Impression:               - One diminutive polyp in the ascending colon,                            removed with a cold snare. Resected and retrieved.                           - One diminutive polyp in the descending colon,                            removed with a cold snare. Resected and retrieved.                           - The examination was otherwise normal on direct                            and retroflexion views. Recommendation:            - Patient has a contact number available for                            emergencies. The signs and symptoms of potential                            delayed complications were discussed with the                            patient. Return to normal activities tomorrow.                            Written discharge instructions were provided to the                            patient.                           - Resume previous  diet.                           - Continue present medications.                           - Await pathology results.                           - Repeat colonoscopy is recommended for                            surveillance. The colonoscopy date will be                            determined after pathology results from today's                            exam become available for review. Henry L. Loletha Carrow, MD 04/27/2019 11:09:47 AM This report has been signed electronically.

## 2019-04-27 NOTE — Patient Instructions (Signed)
YOU HAD AN ENDOSCOPIC PROCEDURE TODAY AT [facility]:   Refer to the procedure report that was given to you for any specific questions about what was found during the examination.  If the procedure report does not answer your questions, please call your gastroenterologist to clarify.  If you requested that your care partner not be given the details of your procedure findings, then the procedure report has been included in a sealed envelope for you to review at your convenience later.  We will contact you via letter to tell you the results of your pathology.  Your next colonoscopy will be determined by the results. Read all handouts given to you by your recovery room nurse.  YOU SHOULD EXPECT: Some feelings of bloating in the abdomen. Passage of more gas than usual.  Walking can help get rid of the air that was put into your GI tract during the procedure and reduce the bloating. If you had a lower endoscopy (such as a colonoscopy or flexible sigmoidoscopy) you may notice spotting of blood in your stool or on the toilet paper. If you underwent a bowel prep for your procedure, you may not have a normal bowel movement for a few days.  Please Note:  You might notice some irritation and congestion in your nose or some drainage.  This is from the oxygen used during your procedure.  There is no need for concern and it should clear up in a day or so.  SYMPTOMS TO REPORT IMMEDIATELY:   Following lower endoscopy (colonoscopy or flexible sigmoidoscopy):  Excessive amounts of blood in the stool  Significant tenderness or worsening of abdominal pains  Swelling of the abdomen that is new, acute  Fever of 100F or higher   For urgent or emergent issues, a gastroenterologist can be reached at any hour by calling [phone].   DIET:  We do recommend a small meal at first, but then you may proceed to your regular diet.  Drink plenty of fluids but you should avoid alcoholic beverages for 24  hours.  ACTIVITY:  You should plan to take it easy for the rest of today and you should NOT DRIVE or use heavy machinery until tomorrow (because of the sedation medicines used during the test).    FOLLOW UP: Our staff will call the number listed on your records 48-72 hours following your procedure to check on you and address any questions or concerns that you may have regarding the information given to you following your procedure. If we do not reach you, we will leave a message.  We will attempt to reach you two times.  During this call, we will ask if you have developed any symptoms of COVID 19. If you develop any symptoms (ie: fever, flu-like symptoms, shortness of breath, cough etc.) before then, please call [phone].  If you test positive for Covid 19 in the 2 weeks post procedure, please call and report this information to [place].    If any biopsies were taken you will be contacted by phone or by letter within the next 1-3 weeks.  Please call us at [phone] if you have not heard about the biopsies in 3 weeks.    SIGNATURES/CONFIDENTIALITY: You and/or your care partner have signed paperwork which will be entered into your electronic medical record.  These signatures attest to the fact that that the information above on your After Visit Summary has been reviewed and is understood.  Full responsibility of the confidentiality of  this discharge information lies with you and/or your care-partner.

## 2019-04-27 NOTE — Progress Notes (Signed)
A/ox3, pleased with MAC, report to RN 

## 2019-04-27 NOTE — Progress Notes (Signed)
Informed dr Loletha Carrow and CRNA that pt ate @ 3546 yesterday.

## 2019-04-29 ENCOUNTER — Telehealth: Payer: Self-pay | Admitting: *Deleted

## 2019-04-29 NOTE — Telephone Encounter (Signed)
  Follow up Call-  Call back number 04/27/2019  Post procedure Call Back phone  # [phone]  Permission to leave phone message Yes  Some recent data might be hidden     Patient questions:  Do you have a fever, pain , or abdominal swelling? No. Pain Score  0 *  Have you tolerated food without any problems? Yes.    Have you been able to return to your normal activities? Yes.    Do you have any questions about your discharge instructions: Diet   No. Medications  No. Follow up visit  No.  Do you have questions or concerns about your Care? No.  Actions: * If pain score is 4 or above: No action needed, pain <4.  1. Have you developed a fever since your procedure?no  2.   Have you had an respiratory symptoms (SOB or cough) since your procedure? no  3.   Have you tested positive for COVID 19 since your procedure no  4.   Have you had any family members/close contacts diagnosed with the COVID 19 since your procedure?  no   If yes to any of these questions please route to Joylene John, RN and Alphonsa Gin, Therapist, sports.

## 2019-04-30 ENCOUNTER — Encounter: Payer: Self-pay | Admitting: Gastroenterology

## 2019-06-09 ENCOUNTER — Encounter: Payer: Self-pay | Admitting: Family Medicine

## 2019-06-23 ENCOUNTER — Other Ambulatory Visit: Payer: Self-pay

## 2019-06-23 ENCOUNTER — Ambulatory Visit: Payer: Self-pay

## 2019-06-23 DIAGNOSIS — H9202 Otalgia, left ear: Secondary | ICD-10-CM

## 2019-06-23 NOTE — Progress Notes (Signed)
     Patient ID: Todd Holt, male    DOB: [birth year], 53 y.o.   MRN: [id number] Patient presented to clinic to have left ear assessed. No drainage noted but wound noted to anterior part of ear canal. Patient stated he has physical on 06/24/19 and will have MD assess it at that time.   Thank you!!  Apolonio Schneiders RN  [place] Nurse Specialist [place]: [phone]  Cell:  [phone] Website: [email or web address]

## 2019-06-24 ENCOUNTER — Encounter: Payer: Self-pay | Admitting: Family Medicine

## 2019-06-24 ENCOUNTER — Ambulatory Visit (INDEPENDENT_AMBULATORY_CARE_PROVIDER_SITE_OTHER): Payer: BC Managed Care – PPO | Admitting: Family Medicine

## 2019-06-24 VITALS — BP 120/82 | HR 76 | Temp 98.2°F | Ht 66.5 in | Wt 198.0 lb

## 2019-06-24 DIAGNOSIS — E785 Hyperlipidemia, unspecified: Secondary | ICD-10-CM | POA: Diagnosis not present

## 2019-06-24 DIAGNOSIS — Z0000 Encounter for general adult medical examination without abnormal findings: Secondary | ICD-10-CM | POA: Diagnosis not present

## 2019-06-24 DIAGNOSIS — Z125 Encounter for screening for malignant neoplasm of prostate: Secondary | ICD-10-CM

## 2019-06-24 DIAGNOSIS — N4889 Other specified disorders of penis: Secondary | ICD-10-CM

## 2019-06-24 DIAGNOSIS — Z113 Encounter for screening for infections with a predominantly sexual mode of transmission: Secondary | ICD-10-CM | POA: Diagnosis not present

## 2019-06-24 DIAGNOSIS — E669 Obesity, unspecified: Secondary | ICD-10-CM | POA: Insufficient documentation

## 2019-06-24 NOTE — Assessment & Plan Note (Signed)
Weight gain reviewed with patient. Encouraged ongoing efforts at healthy diet and lifestyle changes to affect sustainable weight loss. He is motivated to start weight loss challenge at work.

## 2019-06-24 NOTE — Assessment & Plan Note (Signed)
This has resolved.

## 2019-06-24 NOTE — Progress Notes (Signed)
This visit was conducted in person.  BP 120/82 (BP Location: Left Arm, Patient Position: Sitting, Cuff Size: Large)   Pulse 76   Temp 98.2 F (36.8 C) (Temporal)   Ht 5' 6.5" (1.689 m)   Wt 198 lb (89.8 kg)   SpO2 96%   BMI 31.48 kg/m    CC: CPE Subjective:    Patient ID: Todd Holt, male    DOB: [birth year], 53 y.o.   MRN: [id number]  HPI: Todd Holt is a 53 y.o. male presenting on 06/24/2019 for Annual Exam   Prior penile rash has resolved.   Preventative: COLONOSCOPY 04/2019 - TAx2, rpt 7 yrs ([facility]) Prostate cancer screening - discussed, agrees to screen Flu shot - yearly at work Tdap 2014 3-4 partners in the past year. STD screen normal 01/2019. New GF for 7 months - monogamous.  Seatbelt use discussed Sunscreen use discussed. No changing moles. Non smoker - GF smokes inside  Alcohol - 2 beers/day on week days, 8 on weekends  Dentist q6 mo Eye exam yearly  Currently going through divorce.  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 Interim medical history since our last visit reviewed. Allergies and medications reviewed and updated. No outpatient medications prior to visit.   No facility-administered medications prior to visit.      Per HPI unless specifically indicated in ROS section below Review of Systems  Constitutional: Negative for activity change, appetite change, chills, fatigue, fever and unexpected weight change.  HENT: Negative for hearing loss.   Eyes: Negative for visual disturbance.  Respiratory: Negative for cough, chest tightness, shortness of breath and wheezing.   Cardiovascular: Negative for chest pain, palpitations and leg swelling.  Gastrointestinal: Negative for abdominal distention, abdominal pain, blood  in stool, constipation, diarrhea, nausea and vomiting.  Genitourinary: Negative for difficulty urinating and hematuria.  Musculoskeletal: Negative for arthralgias, myalgias and neck pain.  Skin: Negative for rash.  Neurological: Negative for dizziness, seizures, syncope and headaches.  Hematological: Negative for adenopathy. Does not bruise/bleed easily.  Psychiatric/Behavioral: Negative for dysphoric mood. The patient is not nervous/anxious.    Objective:    BP 120/82 (BP Location: Left Arm, Patient Position: Sitting, Cuff Size: Large)   Pulse 76   Temp 98.2 F (36.8 C) (Temporal)   Ht 5' 6.5" (1.689 m)   Wt 198 lb (89.8 kg)   SpO2 96%   BMI 31.48 kg/m   Wt Readings from Last 3 Encounters:  06/24/19 198 lb (89.8 kg)  04/27/19 188 lb (85.3 kg)  04/15/19 175 lb (79.4 kg)    Physical Exam Vitals signs and nursing note reviewed.  Constitutional:      General: He is not in acute distress.    Appearance: Normal appearance. He is well-developed. He is not ill-appearing.  HENT:     Head: Normocephalic and atraumatic.     Right Ear: Hearing, tympanic membrane and external ear normal. There is impacted cerumen.     Left Ear: Hearing, tympanic membrane and external ear normal. There is impacted cerumen.     Ears:     Comments: Removed with plastic curette and alligator forceps, pt tolerated well.     Nose: Nose normal.     Mouth/Throat:     Mouth: Mucous membranes are moist.  Pharynx: Uvula midline. No oropharyngeal exudate or posterior oropharyngeal erythema.  Eyes:     General: No scleral icterus.    Extraocular Movements: Extraocular movements intact.     Conjunctiva/sclera: Conjunctivae normal.     Pupils: Pupils are equal, round, and reactive to light.  Neck:     Musculoskeletal: Normal range of motion and neck supple.  Cardiovascular:     Rate and Rhythm: Normal rate and regular rhythm.     Pulses: Normal pulses.          Radial pulses are 2+ on the right side and 2+  on the left side.     Heart sounds: Normal heart sounds. No murmur.  Pulmonary:     Effort: Pulmonary effort is normal. No respiratory distress.     Breath sounds: Normal breath sounds. No wheezing, rhonchi or rales.  Abdominal:     General: Abdomen is flat. Bowel sounds are normal. There is no distension.     Palpations: Abdomen is soft. There is no mass.     Tenderness: There is no abdominal tenderness. There is no guarding or rebound.     Hernia: No hernia is present.  Genitourinary:    Prostate: Normal. Not enlarged (15gm), not tender and no nodules present.     Rectum: Normal. No mass, tenderness, anal fissure, external hemorrhoid or internal hemorrhoid. Normal anal tone.  Musculoskeletal: Normal range of motion.  Lymphadenopathy:     Cervical: No cervical adenopathy.  Skin:    General: Skin is warm and dry.     Findings: No rash.  Neurological:     General: No focal deficit present.     Mental Status: He is alert and oriented to person, place, and time.     Comments: CN grossly intact, station and gait intact  Psychiatric:        Mood and Affect: Mood normal.        Behavior: Behavior normal.        Thought Content: Thought content normal.        Judgment: Judgment normal.       Results for orders placed or performed in visit on 02/02/19  C. trachomatis/N. gonorrhoeae RNA   Specimen: Urine  Result Value Ref Range   C. trachomatis RNA, TMA NOT DETECTED NOT DETECT   N. gonorrhoeae RNA, TMA NOT DETECTED NOT DETECT   Assessment & Plan:   Problem List Items Addressed This Visit    Penile papules    This has resolved      Obesity, Class I, BMI 30-34.9    Weight gain reviewed with patient. Encouraged ongoing efforts at healthy diet and lifestyle changes to affect sustainable weight loss. He is motivated to start weight loss challenge at work.       HLD (hyperlipidemia)    Update FLP. Not on med.  The ASCVD Risk score Mikey Bussing DC Jr., et al., 2013) failed to calculate  for the following reasons:   Cannot find a previous HDL lab   Cannot find a previous total cholesterol lab       Relevant Orders   Lipid panel   Comprehensive metabolic panel   Healthcare maintenance - Primary    Preventative protocols reviewed and updated unless pt declined. Discussed healthy diet and lifestyle.        Other Visit Diagnoses    Special screening for malignant neoplasm of prostate       Relevant Orders   PSA   Screen for STD (sexually transmitted  disease)       Relevant Orders   HIV Antibody (routine testing w rflx)   RPR       No orders of the defined types were placed in this encounter.  Orders Placed This Encounter  Procedures  . Lipid panel  . Comprehensive metabolic panel  . PSA  . HIV Antibody (routine testing w rflx)  . RPR    Follow up plan: Return in about 1 year (around 06/23/2020) for annual exam, prior fasting for blood work.  Ria Bush, MD

## 2019-06-24 NOTE — Assessment & Plan Note (Signed)
Preventative protocols reviewed and updated unless pt declined. Discussed healthy diet and lifestyle.  

## 2019-06-24 NOTE — Assessment & Plan Note (Addendum)
Update FLP. Not on med.  The ASCVD Risk score Mikey Bussing DC Jr., et al., 2013) failed to calculate for the following reasons:   Cannot find a previous HDL lab   Cannot find a previous total cholesterol lab

## 2019-06-24 NOTE — Patient Instructions (Addendum)
You are doing well today. Watch weight.  Return as needed or in 1 year for next physical.   Health Maintenance, Male Adopting a healthy lifestyle and getting preventive care are important in promoting health and wellness. Ask your health care provider about:  The right schedule for you to have regular tests and exams.  Things you can do on your own to prevent diseases and keep yourself healthy. What should I know about diet, weight, and exercise? Eat a healthy diet   Eat a diet that includes plenty of vegetables, fruits, low-fat dairy products, and lean protein.  Do not eat a lot of foods that are high in solid fats, added sugars, or sodium. Maintain a healthy weight Body mass index (BMI) is a measurement that can be used to identify possible weight problems. It estimates body fat based on height and weight. Your health care provider can help determine your BMI and help you achieve or maintain a healthy weight. Get regular exercise Get regular exercise. This is one of the most important things you can do for your health. Most adults should:  Exercise for at least 150 minutes each week. The exercise should increase your heart rate and make you sweat (moderate-intensity exercise).  Do strengthening exercises at least twice a week. This is in addition to the moderate-intensity exercise.  Spend less time sitting. Even light physical activity can be beneficial. Watch cholesterol and blood lipids Have your blood tested for lipids and cholesterol at 51 years of age, then have this test every 5 years. You may need to have your cholesterol levels checked more often if:  Your lipid or cholesterol levels are high.  You are older than 51 years of age.  You are at high risk for heart disease. What should I know about cancer screening? Many types of cancers can be detected early and may often be prevented. Depending on your health history and family history, you may need to have cancer  screening at various ages. This may include screening for:  Colorectal cancer.  Prostate cancer.  Skin cancer.  Lung cancer. What should I know about heart disease, diabetes, and high blood pressure? Blood pressure and heart disease  High blood pressure causes heart disease and increases the risk of stroke. This is more likely to develop in people who have high blood pressure readings, are of African descent, or are overweight.  Talk with your health care provider about your target blood pressure readings.  Have your blood pressure checked: ? Every 3-5 years if you are 29-43 years of age. ? Every year if you are 61 years old or older.  If you are between the ages of 69 and 71 and are a current or former smoker, ask your health care provider if you should have a one-time screening for abdominal aortic aneurysm (AAA). Diabetes Have regular diabetes screenings. This checks your fasting blood sugar level. Have the screening done:  Once every three years after age 28 if you are at a normal weight and have a low risk for diabetes.  More often and at a younger age if you are overweight or have a high risk for diabetes. What should I know about preventing infection? Hepatitis B If you have a higher risk for hepatitis B, you should be screened for this virus. Talk with your health care provider to find out if you are at risk for hepatitis B infection. Hepatitis C Blood testing is recommended for:  Everyone born from [birth year] through 1963.  Anyone with known risk factors for hepatitis C. Sexually transmitted infections (STIs)  You should be screened each year for STIs, including gonorrhea and chlamydia, if: ? You are sexually active and are younger than 51 years of age. ? You are older than 51 years of age and your health care provider tells you that you are at risk for this type of infection. ? Your sexual activity has changed since you were last screened, and you are at increased risk  for chlamydia or gonorrhea. Ask your health care provider if you are at risk.  Ask your health care provider about whether you are at high risk for HIV. Your health care provider may recommend a prescription medicine to help prevent HIV infection. If you choose to take medicine to prevent HIV, you should first get tested for HIV. You should then be tested every 3 months for as long as you are taking the medicine. Follow these instructions at home: Lifestyle  Do not use any products that contain nicotine or tobacco, such as cigarettes, e-cigarettes, and chewing tobacco. If you need help quitting, ask your health care provider.  Do not use street drugs.  Do not share needles.  Ask your health care provider for help if you need support or information about quitting drugs. Alcohol use  Do not drink alcohol if your health care provider tells you not to drink.  If you drink alcohol: ? Limit how much you have to 0-2 drinks a day. ? Be aware of how much alcohol is in your drink. In the [place] one drink equals one 12 oz bottle of beer (355 mL), one 5 oz glass of wine (148 mL), or one 1 oz glass of hard liquor (44 mL). General instructions  Schedule regular health, dental, and eye exams.  Stay current with your vaccines.  Tell your health care provider if: ? You often feel depressed. ? You have ever been abused or do not feel safe at home. Summary  Adopting a healthy lifestyle and getting preventive care are important in promoting health and wellness.  Follow your health care provider's instructions about healthy diet, exercising, and getting tested or screened for diseases.  Follow your health care provider's instructions on monitoring your cholesterol and blood pressure. This information is not intended to replace advice given to you by your health care provider. Make sure you discuss any questions you have with your health care provider. Document Released: 05/03/2008 Document Revised:  10/29/2018 Document Reviewed: 10/29/2018 Elsevier Patient Education  2020 [employer].

## 2019-06-25 LAB — COMPREHENSIVE METABOLIC PANEL
ALT: 23 U/L (ref 0–53)
AST: 23 U/L (ref 0–37)
Albumin: 4.7 g/dL (ref 3.5–5.2)
Alkaline Phosphatase: 44 U/L (ref 39–117)
BUN: 14 mg/dL (ref 6–23)
CO2: 26 mEq/L (ref 19–32)
Calcium: 9.7 mg/dL (ref 8.4–10.5)
Chloride: 103 mEq/L (ref 96–112)
Creatinine, Ser: 1.02 mg/dL (ref 0.40–1.50)
GFR: 92.99 mL/min (ref >60.00)
Glucose, Bld: 89 mg/dL (ref 70–99)
Potassium: 4.3 mEq/L (ref 3.5–5.1)
Sodium: 138 mEq/L (ref 135–145)
Total Bilirubin: 0.6 mg/dL (ref 0.2–1.2)
Total Protein: 7.5 g/dL (ref 6.0–8.3)

## 2019-06-25 LAB — LIPID PANEL
Cholesterol: 230 mg/dL — ABNORMAL HIGH (ref 0–200)
HDL: 85.1 mg/dL (ref >39.00)
LDL Cholesterol: 135 mg/dL — ABNORMAL HIGH (ref 0–99)
NonHDL: 145.31
Total CHOL/HDL Ratio: 3
Triglycerides: 53 mg/dL (ref 0.0–149.0)
VLDL: 10.6 mg/dL (ref 0.0–40.0)

## 2019-06-25 LAB — HIV ANTIBODY (ROUTINE TESTING W REFLEX): HIV 1&2 Ab, 4th Generation: NONREACTIVE

## 2019-06-25 LAB — PSA: PSA: 0.46 ng/mL (ref 0.10–4.00)

## 2019-06-25 LAB — RPR: RPR Ser Ql: NONREACTIVE

## 2019-08-04 ENCOUNTER — Other Ambulatory Visit: Payer: Self-pay

## 2019-08-04 ENCOUNTER — Ambulatory Visit: Payer: Self-pay

## 2019-08-04 DIAGNOSIS — Z23 Encounter for immunization: Secondary | ICD-10-CM

## 2019-08-04 NOTE — Patient Instructions (Signed)

## 2019-08-04 NOTE — Progress Notes (Signed)
     Patient ID: Todd Holt, male    DOB: 08/04/1966, 53 y.o.   MRN: [id number]    Thank you!!  [place] Nurse Specialist [place]: [phone]  Cell:  [phone] Website: [email or web address]

## 2019-08-13 ENCOUNTER — Ambulatory Visit: Payer: Self-pay

## 2019-08-13 ENCOUNTER — Other Ambulatory Visit: Payer: Self-pay

## 2019-08-13 VITALS — BP 148/96 | HR 97 | Resp 14 | Ht 66.0 in | Wt 201.0 lb

## 2019-08-13 DIAGNOSIS — Z008 Encounter for other general examination: Secondary | ICD-10-CM

## 2019-08-13 LAB — POCT LIPID PANEL
HDL: 79
LDL: 87
Non-HDL: 115
POC Glucose: 96 mg/dl (ref 70–99)
TC/HDL: 2.4
TC: 194
TRG: 139

## 2019-08-13 NOTE — Progress Notes (Signed)
     Patient ID: Todd Holt, male    DOB: December 14, 1965, 53 y.o.   MRN: [id number]    Thank you!!  [place] Nurse Specialist [place]: [phone]  Cell:  [phone] Website: [email or web address]

## 2020-03-04 ENCOUNTER — Other Ambulatory Visit: Payer: Self-pay

## 2020-03-04 ENCOUNTER — Encounter (HOSPITAL_COMMUNITY): Payer: Self-pay

## 2020-03-04 ENCOUNTER — Ambulatory Visit (HOSPITAL_COMMUNITY)
Admission: EM | Admit: 2020-03-04 | Discharge: 2020-03-04 | Disposition: A | Discharge status: Home / Self Care | Payer: BC Managed Care – PPO | Attending: Family Medicine | Admitting: Family Medicine

## 2020-03-04 DIAGNOSIS — R1013 Epigastric pain: Secondary | ICD-10-CM

## 2020-03-04 MED ORDER — LIDOCAINE VISCOUS HCL 2 % MT SOLN
15.0000 mL | Freq: Once | OROMUCOSAL | Status: AC
  Administered 2020-03-04: 15 mL via ORAL

## 2020-03-04 MED ORDER — LIDOCAINE VISCOUS HCL 2 % MT SOLN
OROMUCOSAL | Status: AC
  Filled 2020-03-04: qty 15

## 2020-03-04 MED ORDER — OMEPRAZOLE 40 MG PO CPDR: 40.0000 mg | DELAYED_RELEASE_CAPSULE | Freq: Every day | ORAL | 0 refills | Status: DC

## 2020-03-04 MED ORDER — ALUM & MAG HYDROXIDE-SIMETH 200-200-20 MG/5ML PO SUSP
30.0000 mL | Freq: Once | ORAL | Status: AC
  Administered 2020-03-04: 30 mL via ORAL

## 2020-03-04 MED ORDER — ALUM & MAG HYDROXIDE-SIMETH 200-200-20 MG/5ML PO SUSP
ORAL | Status: AC
  Filled 2020-03-04: qty 30
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
## 2020-03-04 NOTE — ED Triage Notes (Signed)
Pt states he has stomach ache. Pt states he ate some [employer] and the stomach pain started today.

## 2020-03-04 NOTE — Discharge Instructions (Signed)
Take one a day for 2 weeks Avoid highly spiced foods a few days Return as needed

## 2020-06-28 ENCOUNTER — Other Ambulatory Visit: Payer: Self-pay

## 2020-06-28 ENCOUNTER — Encounter: Payer: Self-pay | Admitting: Family Medicine

## 2020-06-28 ENCOUNTER — Ambulatory Visit (INDEPENDENT_AMBULATORY_CARE_PROVIDER_SITE_OTHER): Payer: BC Managed Care – PPO | Admitting: Family Medicine

## 2020-06-28 VITALS — BP 140/98 | HR 86 | Temp 98.6°F | Ht 66.5 in | Wt 203.0 lb

## 2020-06-28 DIAGNOSIS — Z0000 Encounter for general adult medical examination without abnormal findings: Secondary | ICD-10-CM | POA: Diagnosis not present

## 2020-06-28 DIAGNOSIS — R252 Cramp and spasm: Secondary | ICD-10-CM | POA: Diagnosis not present

## 2020-06-28 DIAGNOSIS — E785 Hyperlipidemia, unspecified: Secondary | ICD-10-CM

## 2020-06-28 DIAGNOSIS — E669 Obesity, unspecified: Secondary | ICD-10-CM | POA: Diagnosis not present

## 2020-06-28 DIAGNOSIS — H269 Unspecified cataract: Secondary | ICD-10-CM | POA: Insufficient documentation

## 2020-06-28 DIAGNOSIS — R030 Elevated blood-pressure reading, without diagnosis of hypertension: Secondary | ICD-10-CM

## 2020-06-28 DIAGNOSIS — H259 Unspecified age-related cataract: Secondary | ICD-10-CM

## 2020-06-28 DIAGNOSIS — Z125 Encounter for screening for malignant neoplasm of prostate: Secondary | ICD-10-CM | POA: Diagnosis not present

## 2020-06-28 NOTE — Assessment & Plan Note (Signed)
Predominantly noted with sex. Check labs today. Good pulses pointing against arterial insufficiency, but if ongoing consider ABI.

## 2020-06-28 NOTE — Progress Notes (Signed)
This visit was conducted in person.  BP (!) 140/98   Pulse 86   Temp 98.6 F (37 C)   Ht 5' 6.5" (1.689 m)   Wt 203 lb (92.1 kg)   SpO2 96%   BMI 32.27 kg/m   144/92 on recheck CC: CPE Subjective:    Patient ID: Todd Holt, male    DOB: 10/28/1966, 54 y.o.   MRN: [id number]  HPI: Todd Holt is a 54 y.o. male presenting on 06/28/2020 for Annual Exam   Weight gain noted. BP increase also noted. Some R ear pain.  Notes left leg cramping and posterior thigh pain with sex - with residual pain for 1-2 days.   Preventative: COLONOSCOPY 04/2019 - TAx2, rpt 7 yrs ([facility]) Prostate cancer screening - discussed, update PSA today  Flu shot - yearly  Tdap 2014 [place] 01/2020, 02/2020 Monogamous relationship with GF.  Seatbelt use discussed Sunscreen use discussed. No changing moles. 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 Interim medical history since our last visit reviewed. Allergies and medications reviewed and updated. Outpatient Medications Prior to Visit  Medication Sig Dispense Refill  . omeprazole (PRILOSEC) 40 MG capsule Take 1 capsule (40 mg total) by mouth daily. 15 capsule 0   No facility-administered medications prior to visit.     Per HPI unless specifically indicated in ROS section below Review of Systems  Constitutional: Negative for activity change, appetite change, chills, fatigue, fever and unexpected weight change.  HENT: Negative for hearing loss.   Eyes: Negative for visual disturbance.  Respiratory: Negative for cough, chest tightness, shortness of breath and wheezing.     Cardiovascular: Negative for chest pain, palpitations and leg swelling.  Gastrointestinal: Negative for abdominal distention, abdominal pain, blood in stool, constipation, diarrhea, nausea and vomiting.  Genitourinary: Negative for difficulty urinating and hematuria.  Musculoskeletal: Negative for arthralgias, myalgias and neck pain.  Skin: Negative for rash.  Neurological: Negative for dizziness, seizures, syncope and headaches.  Hematological: Negative for adenopathy. Does not bruise/bleed easily.  Psychiatric/Behavioral: Negative for dysphoric mood. The patient is not nervous/anxious.    Objective:  BP (!) 140/98   Pulse 86   Temp 98.6 F (37 C)   Ht 5' 6.5" (1.689 m)   Wt 203 lb (92.1 kg)   SpO2 96%   BMI 32.27 kg/m   Wt Readings from Last 3 Encounters:  06/28/20 203 lb (92.1 kg)  03/04/20 205 lb (93 kg)  08/13/19 201 lb (91.2 kg)      Physical Exam Vitals and nursing note reviewed.  Constitutional:      General: He is not in acute distress.    Appearance: Normal appearance. He is well-developed. He is not ill-appearing.  HENT:     Head: Normocephalic and atraumatic.     Right Ear: Hearing, tympanic membrane, ear canal and external ear normal.     Left Ear: Hearing, tympanic membrane, ear canal and external ear normal.     Ears:     Comments: Hard wax removed from R canal Eyes:     General: No scleral  icterus.    Extraocular Movements: Extraocular movements intact.     Conjunctiva/sclera: Conjunctivae normal.     Pupils: Pupils are equal, round, and reactive to light.     Comments: Cataracts present bilaterally  Neck:     Thyroid: No thyroid mass, thyromegaly or thyroid tenderness.  Cardiovascular:     Rate and Rhythm: Normal rate and regular rhythm.     Pulses: Normal pulses.          Radial pulses are 2+ on the right side and 2+ on the left side.     Heart sounds: Normal heart sounds. No murmur heard.   Pulmonary:     Effort: Pulmonary effort is normal. No  respiratory distress.     Breath sounds: Normal breath sounds. No wheezing, rhonchi or rales.  Abdominal:     General: Abdomen is flat. Bowel sounds are normal. There is no distension.     Palpations: Abdomen is soft. There is no mass.     Tenderness: There is no abdominal tenderness. There is no guarding or rebound.     Hernia: No hernia is present.  Musculoskeletal:        General: Normal range of motion.     Cervical back: Normal range of motion and neck supple.     Right lower leg: No edema.     Left lower leg: No edema.     Comments: 2+ DP bilaterally  Lymphadenopathy:     Cervical: No cervical adenopathy.  Skin:    General: Skin is warm and dry.     Findings: No rash.  Neurological:     General: No focal deficit present.     Mental Status: He is alert and oriented to person, place, and time.     Comments: CN grossly intact, station and gait intact  Psychiatric:        Mood and Affect: Mood normal.        Behavior: Behavior normal.        Thought Content: Thought content normal.        Judgment: Judgment normal.       Results for orders placed or performed in visit on 08/13/19  POCT Lipid Panel  Result Value Ref Range   TC 194    HDL 79    TRG 139    LDL 87    Non-HDL 115    TC/HDL 2.4    POC Glucose 96 70 - 99 mg/dl   Lab Results  Component Value Date   PSA 0.46 06/24/2019   Assessment & Plan:  This visit occurred during the SARS-CoV-2 public health emergency.  Safety protocols were in place, including screening questions prior to the visit, additional usage of staff PPE, and extensive cleaning of exam room while observing appropriate contact time as indicated for disinfecting solutions.   Problem List Items Addressed This Visit    Obesity, Class I, BMI 30-34.9    Encouraged healthy diet and lifestyle changes to affect sustainable weight loss. Discussed decreasing sweetened beverages      Leg cramping    Predominantly noted with sex. Check labs today. Good  pulses pointing against arterial insufficiency, but if ongoing consider ABI.      Relevant Orders   Magnesium   HLD (hyperlipidemia)    Update FLP. Not on statin.       Relevant Orders   Lipid panel   Comprehensive metabolic panel   TSH   Healthcare maintenance - Primary    Preventative protocols  reviewed and updated unless pt declined. Discussed healthy diet and lifestyle.       Elevated blood pressure reading in office without diagnosis of hypertension    Elevated BP reading reviewed with patient today, as well as recommended goal BP range. DASH diet handout provided today. He will have work Therapist, sports monitor BP weekly over next few weeks and let me know if consistently >140/90 to start antihypertensive. Pt agrees with plan.       Cataracts, both eyes    Noted today - encouraged eye exam.        Other Visit Diagnoses    Special screening for malignant neoplasm of prostate       Relevant Orders   PSA       No orders of the defined types were placed in this encounter.  Orders Placed This Encounter  Procedures  . Lipid panel  . Comprehensive metabolic panel  . PSA  . TSH  . Magnesium    Patient instructions: Labs today  Back off sweetened beverages to help with weight.  Back off salt/sodium in the diet to help with blood pressure.  Start monitoring blood pressure at work, let me know if consistently >140/90. Ideally would want closer to 120-130s/70-80s.  Return as needed or in 1 year for next physical.   Follow up plan: Return in about 1 year (around 06/28/2021) for annual exam, prior fasting for blood work.  Ria Bush, MD

## 2020-06-28 NOTE — Assessment & Plan Note (Signed)
Encouraged healthy diet and lifestyle changes to affect sustainable weight loss. Discussed decreasing sweetened beverages

## 2020-06-28 NOTE — Patient Instructions (Addendum)
Labs today  Back off sweetened beverages to help with weight.  Back off salt/sodium in the diet to help with blood pressure.  Start monitoring blood pressure at work, let me know if consistently >140/90. Ideally would want closer to 120-130s/70-80s.  Return as needed or in 1 year for next physical.   DASH Eating Plan DASH stands for "Dietary Approaches to Stop Hypertension." The DASH eating plan is a healthy eating plan that has been shown to reduce high blood pressure (hypertension). It may also reduce your risk for type 2 diabetes, heart disease, and stroke. The DASH eating plan may also help with weight loss. What are tips for following this plan?  General guidelines  Avoid eating more than 2,300 mg (milligrams) of salt (sodium) a day. If you have hypertension, you may need to reduce your sodium intake to 1,500 mg a day.  Limit alcohol intake to no more than 1 drink a day for nonpregnant women and 2 drinks a day for men. One drink equals 12 oz of beer, 5 oz of wine, or 1 oz of hard liquor.  Work with your health care provider to maintain a healthy body weight or to lose weight. Ask what an ideal weight is for you.  Get at least 30 minutes of exercise that causes your heart to beat faster (aerobic exercise) most days of the week. Activities may include walking, swimming, or biking.  Work with your health care provider or diet and nutrition specialist (dietitian) to adjust your eating plan to your individual calorie needs. Reading food labels   Check food labels for the amount of sodium per serving. Choose foods with less than 5 percent of the Daily Value of sodium. Generally, foods with less than 300 mg of sodium per serving fit into this eating plan.  To find whole grains, look for the word "whole" as the first word in the ingredient list. Shopping  Buy products labeled as "low-sodium" or "no salt added."  Buy fresh foods. Avoid canned foods and premade or frozen  meals. Cooking  Avoid adding salt when cooking. Use salt-free seasonings or herbs instead of table salt or sea salt. Check with your health care provider or pharmacist before using salt substitutes.  Do not fry foods. Cook foods using healthy methods such as baking, boiling, grilling, and broiling instead.  Cook with heart-healthy oils, such as olive, canola, soybean, or sunflower oil. Meal planning  Eat a balanced diet that includes: ? 5 or more servings of fruits and vegetables each day. At each meal, try to fill half of your plate with fruits and vegetables. ? Up to 6-8 servings of whole grains each day. ? Less than 6 oz of lean meat, poultry, or fish each day. A 3-oz serving of meat is about the same size as a deck of cards. One egg equals 1 oz. ? 2 servings of low-fat dairy each day. ? A serving of nuts, seeds, or beans 5 times each week. ? Heart-healthy fats. Healthy fats called Omega-3 fatty acids are found in foods such as flaxseeds and coldwater fish, like sardines, salmon, and mackerel.  Limit how much you eat of the following: ? Canned or prepackaged foods. ? Food that is high in trans fat, such as fried foods. ? Food that is high in saturated fat, such as fatty meat. ? Sweets, desserts, sugary drinks, and other foods with added sugar. ? Full-fat dairy products.  Do not salt foods before eating.  Try to eat at least  2 vegetarian meals each week.  Eat more home-cooked food and less restaurant, buffet, and fast food.  When eating at a restaurant, ask that your food be prepared with less salt or no salt, if possible. What foods are recommended? The items listed may not be a complete list. Talk with your dietitian about what dietary choices are best for you. Grains Whole-grain or whole-wheat bread. Whole-grain or whole-wheat pasta. Brown rice. [place]. Bulgur. Whole-grain and low-sodium cereals. Pita bread. Low-fat, low-sodium crackers. Whole-wheat flour  tortillas. Vegetables Fresh or frozen vegetables (raw, steamed, roasted, or grilled). Low-sodium or reduced-sodium tomato and vegetable juice. Low-sodium or reduced-sodium tomato sauce and tomato paste. Low-sodium or reduced-sodium canned vegetables. Fruits All fresh, dried, or frozen fruit. Canned fruit in natural juice (without added sugar). Meat and other protein foods Skinless chicken or [place]. Ground chicken or [place]. Pork with fat trimmed off. Fish and seafood. Egg whites. Dried beans, peas, or lentils. Unsalted nuts, nut butters, and seeds. Unsalted canned beans. Lean cuts of beef with fat trimmed off. Low-sodium, lean deli meat. Dairy Low-fat (1%) or fat-free (skim) milk. Fat-free, low-fat, or reduced-fat cheeses. Nonfat, low-sodium ricotta or cottage cheese. Low-fat or nonfat yogurt. Low-fat, low-sodium cheese. Fats and oils Soft margarine without trans fats. Vegetable oil. Low-fat, reduced-fat, or light mayonnaise and salad dressings (reduced-sodium). Canola, safflower, olive, soybean, and sunflower oils. Avocado. Seasoning and other foods Herbs. Spices. Seasoning mixes without salt. Unsalted popcorn and pretzels. Fat-free sweets. What foods are not recommended? The items listed may not be a complete list. Talk with your dietitian about what dietary choices are best for you. Grains Baked goods made with fat, such as croissants, muffins, or some breads. Dry pasta or rice meal packs. Vegetables Creamed or fried vegetables. Vegetables in a cheese sauce. Regular canned vegetables (not low-sodium or reduced-sodium). Regular canned tomato sauce and paste (not low-sodium or reduced-sodium). Regular tomato and vegetable juice (not low-sodium or reduced-sodium). Angie Fava. Olives. Fruits Canned fruit in a light or heavy syrup. Fried fruit. Fruit in cream or butter sauce. Meat and other protein foods Fatty cuts of meat. Ribs. Fried meat. Berniece Salines. Sausage. Bologna and other processed lunch meats.  Salami. Fatback. Hotdogs. Bratwurst. Salted nuts and seeds. Canned beans with added salt. Canned or smoked fish. Whole eggs or egg yolks. Chicken or [place] with skin. Dairy Whole or 2% milk, cream, and half-and-half. Whole or full-fat cream cheese. Whole-fat or sweetened yogurt. Full-fat cheese. Nondairy creamers. Whipped toppings. Processed cheese and cheese spreads. Fats and oils Butter. Stick margarine. Lard. Shortening. Ghee. Bacon fat. Tropical oils, such as coconut, palm kernel, or palm oil. Seasoning and other foods Salted popcorn and pretzels. Onion salt, garlic salt, seasoned salt, table salt, and sea salt. Worcestershire sauce. Tartar sauce. Barbecue sauce. Teriyaki sauce. Soy sauce, including reduced-sodium. Steak sauce. Canned and packaged gravies. Fish sauce. Oyster sauce. Cocktail sauce. Horseradish that you find on the shelf. Ketchup. Mustard. Meat flavorings and tenderizers. Bouillon cubes. Hot sauce and Tabasco sauce. Premade or packaged marinades. Premade or packaged taco seasonings. Relishes. Regular salad dressings. Where to find more information:  National Heart, Lung, and [place]: https://wilson-eaton.com/  American Heart Association: www.heart.org Summary  The DASH eating plan is a healthy eating plan that has been shown to reduce high blood pressure (hypertension). It may also reduce your risk for type 2 diabetes, heart disease, and stroke.  With the DASH eating plan, you should limit salt (sodium) intake to 2,300 mg a day. If you have hypertension, you may  need to reduce your sodium intake to 1,500 mg a day.  When on the DASH eating plan, aim to eat more fresh fruits and vegetables, whole grains, lean proteins, low-fat dairy, and heart-healthy fats.  Work with your health care provider or diet and nutrition specialist (dietitian) to adjust your eating plan to your individual calorie needs. This information is not intended to replace advice given to you by your health  care provider. Make sure you discuss any questions you have with your health care provider. Document Revised: 10/18/2017 Document Reviewed: 10/29/2016 Elsevier Patient Education  2020 [employer].

## 2020-06-28 NOTE — Assessment & Plan Note (Signed)
Elevated BP reading reviewed with patient today, as well as recommended goal BP range. DASH diet handout provided today. He will have work Therapist, sports monitor BP weekly over next few weeks and let me know if consistently >140/90 to start antihypertensive. Pt agrees with plan.

## 2020-06-28 NOTE — Assessment & Plan Note (Signed)
Noted today - encouraged eye exam.

## 2020-06-28 NOTE — Assessment & Plan Note (Signed)
Update FLP. Not on statin.

## 2020-06-28 NOTE — Assessment & Plan Note (Signed)
Preventative protocols reviewed and updated unless pt declined. Discussed healthy diet and lifestyle.  

## 2020-06-29 LAB — COMPREHENSIVE METABOLIC PANEL
ALT: 29 U/L (ref 0–53)
AST: 25 U/L (ref 0–37)
Albumin: 4.7 g/dL (ref 3.5–5.2)
Alkaline Phosphatase: 57 U/L (ref 39–117)
BUN: 12 mg/dL (ref 6–23)
CO2: 24 mEq/L (ref 19–32)
Calcium: 9.8 mg/dL (ref 8.4–10.5)
Chloride: 103 mEq/L (ref 96–112)
Creatinine, Ser: 1.15 mg/dL (ref 0.40–1.50)
GFR: 80.65 mL/min (ref >60.00)
Glucose, Bld: 99 mg/dL (ref 70–99)
Potassium: 4.4 mEq/L (ref 3.5–5.1)
Sodium: 140 mEq/L (ref 135–145)
Total Bilirubin: 0.5 mg/dL (ref 0.2–1.2)
Total Protein: 7.7 g/dL (ref 6.0–8.3)

## 2020-06-29 LAB — LIPID PANEL
Cholesterol: 221 mg/dL — ABNORMAL HIGH (ref 0–200)
HDL: 66.4 mg/dL (ref >39.00)
LDL Cholesterol: 122 mg/dL — ABNORMAL HIGH (ref 0–99)
NonHDL: 154.63
Total CHOL/HDL Ratio: 3
Triglycerides: 164 mg/dL — ABNORMAL HIGH (ref 0.0–149.0)
VLDL: 32.8 mg/dL (ref 0.0–40.0)

## 2020-06-29 LAB — MAGNESIUM: Magnesium: 2.3 mg/dL (ref 1.5–2.5)

## 2020-06-29 LAB — PSA: PSA: 0.34 ng/mL (ref 0.10–4.00)

## 2020-06-29 LAB — TSH: TSH: 0.63 u[IU]/mL (ref 0.35–4.50)

## 2020-12-19 ENCOUNTER — Other Ambulatory Visit: Payer: Self-pay

## 2020-12-19 ENCOUNTER — Emergency Department
Admission: RE | Admit: 2020-12-19 | Discharge: 2020-12-19 | Disposition: A | Discharge status: Home / Self Care | Payer: BC Managed Care – PPO | Source: Ambulatory Visit

## 2020-12-19 VITALS — BP 144/101 | HR 69 | Temp 99.2°F | Resp 16 | Ht 66.5 in | Wt 206.0 lb

## 2020-12-19 DIAGNOSIS — R519 Headache, unspecified: Secondary | ICD-10-CM

## 2020-12-19 DIAGNOSIS — J029 Acute pharyngitis, unspecified: Secondary | ICD-10-CM

## 2020-12-19 DIAGNOSIS — J020 Streptococcal pharyngitis: Secondary | ICD-10-CM | POA: Diagnosis not present

## 2020-12-19 LAB — POCT RAPID STREP A (OFFICE): Rapid Strep A Screen: POSITIVE — AB

## 2020-12-19 MED ORDER — AMOXICILLIN 500 MG PO CAPS: 500.0000 mg | ORAL_CAPSULE | Freq: Two times a day (BID) | ORAL | 0 refills | Status: AC
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
## 2020-12-19 NOTE — Discharge Instructions (Signed)

## 2020-12-19 NOTE — ED Triage Notes (Signed)
Sore throat since yesterday  HA yesterday only No OTC meds COVID vaccine ( April 2021) -no booster Denies fever or chills

## 2020-12-21 LAB — SARS-COV-2 RNA,(COVID-19) QUALITATIVE NAAT: SARS CoV2 RNA: NOT DETECTED

## 2020-12-28 ENCOUNTER — Other Ambulatory Visit: Payer: Self-pay

## 2020-12-28 ENCOUNTER — Ambulatory Visit: Payer: BC Managed Care – PPO | Admitting: Family Medicine

## 2020-12-28 ENCOUNTER — Encounter: Payer: Self-pay | Admitting: Family Medicine

## 2020-12-28 VITALS — BP 140/78 | HR 94 | Temp 97.9°F | Ht 66.5 in | Wt 205.3 lb

## 2020-12-28 DIAGNOSIS — R9431 Abnormal electrocardiogram [ECG] [EKG]: Secondary | ICD-10-CM

## 2020-12-28 DIAGNOSIS — I10 Essential (primary) hypertension: Secondary | ICD-10-CM | POA: Diagnosis not present

## 2020-12-28 MED ORDER — AMLODIPINE BESYLATE 2.5 MG PO TABS: 2.5000 mg | ORAL_TABLET | Freq: Every day | ORAL | 6 refills | Status: DC

## 2020-12-28 NOTE — Progress Notes (Addendum)
Patient ID: Todd Holt, male    DOB: 1966/10/31, 55 y.o.   MRN: [id number]  This visit was conducted in person.  BP 140/78 (BP Location: Left Arm, Patient Position: Sitting, Cuff Size: Large)    Pulse 94    Temp 97.9 F (36.6 C) (Temporal)    Ht 5' 6.5" (1.689 m)    Wt 205 lb 5 oz (93.1 kg)    SpO2 96%    BMI 32.64 kg/m   BP Readings from Last 3 Encounters:  12/28/20 140/78  12/19/20 (!) 144/101  06/28/20 (!) 140/98   CC: check BP Subjective:   HPI: Todd Holt is a 55 y.o. male presenting on 12/28/2020 for Elevated Blood Pressure (C/o elevated BP readings.  Pt seen at UC last week for strep throat and had elevated BP.  They suggested pt see PCP. )   Seen at [facility] last month with strep throat treated with amoxicillin. BP at that time elevated to 983J systolic.   Going through divorce with wife. Poor diet. 2 jobs. High stress.   Elevated BP readings - Does not check blood pressures at home. No low blood pressure readings or symptoms of dizziness/syncope. Denies HA, vision changes, CP/tightness, SOB, leg swelling.       Relevant past medical, surgical, family and social history reviewed and updated as indicated. Interim medical history since our last visit reviewed. Allergies and medications reviewed and updated. Outpatient Medications Prior to Visit  Medication Sig Dispense Refill   amoxicillin (AMOXIL) 500 MG capsule Take 1 capsule (500 mg total) by mouth 2 (two) times daily for 10 days. 20 capsule 0   No facility-administered medications prior to visit.     Per HPI unless specifically indicated in ROS section below Review of Systems Objective:  BP 140/78 (BP Location: Left Arm, Patient Position: Sitting, Cuff Size: Large)    Pulse 94    Temp 97.9 F (36.6 C) (Temporal)    Ht 5' 6.5" (1.689 m)    Wt 205 lb 5 oz (93.1 kg)    SpO2 96%    BMI 32.64 kg/m   Wt Readings from Last 3 Encounters:  12/28/20 205 lb 5 oz (93.1 kg)  12/19/20 206 lb (93.4 kg)  06/28/20 203  lb (92.1 kg)      Physical Exam Vitals and nursing note reviewed.  Constitutional:      Appearance: Normal appearance. He is not ill-appearing.  Neck:     Thyroid: No thyroid mass or thyromegaly.  Cardiovascular:     Rate and Rhythm: Normal rate and regular rhythm.     Pulses: Normal pulses.     Heart sounds: Normal heart sounds. No murmur heard.   Pulmonary:     Effort: Pulmonary effort is normal. No respiratory distress.     Breath sounds: Normal breath sounds. No wheezing, rhonchi or rales.  Musculoskeletal:     Right lower leg: No edema.     Left lower leg: No edema.  Skin:    General: Skin is warm and dry.  Neurological:     Mental Status: He is alert.  Psychiatric:        Mood and Affect: Mood normal.        Behavior: Behavior normal.       Lab Results  Component Value Date   CREATININE 1.15 06/28/2020   BUN 12 06/28/2020   NA 140 06/28/2020   K 4.4 06/28/2020   CL 103 06/28/2020   CO2 24 06/28/2020  Lab Results  Component Value Date   TSH 0.63 06/28/2020    EKG - NSR rate 75, LAD, normal intervals, diffuse T wave inversions inferiorly and laterally, no acute ST/T changes Assessment & Plan:  This visit occurred during the SARS-CoV-2 public health emergency.  Safety protocols were in place, including screening questions prior to the visit, additional usage of staff PPE, and extensive cleaning of exam room while observing appropriate contact time as indicated for disinfecting solutions.   Problem List Items Addressed This Visit    Hypertension - Primary    Borderline elevated today but high readings in the past - pt agrees to trial amlodipine 2.5mg  daily. Also reviewed diet and lifestyle choices to help control blood pressure including regular cardio and DASH diet (handout provided). Baseline EKG today. Return in 2 month for HTN f/u visit.       Relevant Medications   amLODipine (NORVASC) 2.5 MG tablet   Other Relevant Orders   EKG 12-Lead (Completed)    Abnormal EKG    Diffuse T wave inversions. Patient asymptomatic. No strong fmhx CAD. Recheck EKG at f/u visit 02/2021. If persistent abnormality, low threshold to check echo and refer to cards.           Meds ordered this encounter  Medications   amLODipine (NORVASC) 2.5 MG tablet    Sig: Take 1 tablet (2.5 mg total) by mouth daily.    Dispense:  30 tablet    Refill:  6   Orders Placed This Encounter  Procedures   EKG 12-Lead    Patient instructions: Start amlodipine 2.5mg  daily for blood pressure. Consider buying BP cuff for home use to monitor. Have nurse at work keep an eye on your blood pressure as well.  EKG today.  Your goal blood pressure is <140/90, ideally lower. Work on low salt/sodium diet - goal <1.5gm (1,500mg ) per day. Eat a diet high in fruits/vegetables and whole grains.  Look into mediterranean and DASH diet. Goal activity is 154min/wk of moderate intensity exercise.  This can be split into 30 minute chunks.  If you are not at this level, you can start with smaller 10-15 min increments and slowly build up activity. Look at [place].org for more resources  Happy early birthday!   Follow up plan: Return in about 6 weeks (around 02/08/2021), or if symptoms worsen or fail to improve, for follow up visit.  Ria Bush, MD

## 2020-12-28 NOTE — Addendum Note (Signed)
Addended by: Brenton Grills on: 11/25/107 32:35 PM   Modules accepted: Orders

## 2020-12-28 NOTE — Assessment & Plan Note (Signed)
Borderline elevated today but high readings in the past - pt agrees to trial amlodipine 2.5mg  daily. Also reviewed diet and lifestyle choices to help control blood pressure including regular cardio and DASH diet (handout provided). Baseline EKG today. Return in 2 month for HTN f/u visit.

## 2020-12-28 NOTE — Patient Instructions (Addendum)
Start amlodipine 2.5mg  daily for blood pressure. Consider buying BP cuff for home use to monitor. Have nurse at work keep an eye on your blood pressure as well.  EKG today.  Your goal blood pressure is <140/90, ideally lower. Work on low salt/sodium diet - goal <1.5gm (1,500mg ) per day. Eat a diet high in fruits/vegetables and whole grains.  Look into mediterranean and DASH diet. Goal activity is 177min/wk of moderate intensity exercise.  This can be split into 30 minute chunks.  If you are not at this level, you can start with smaller 10-15 min increments and slowly build up activity. Look at [place].org for more resources  Return in 2 months for hypertension follow up visit.  Happy early birthday!   [email or web address]">  DASH Eating Plan DASH stands for Dietary Approaches to Stop Hypertension. The DASH eating plan is a healthy eating plan that has been shown to:  Reduce high blood pressure (hypertension).  Reduce your risk for type 2 diabetes, heart disease, and stroke.  Help with weight loss. What are tips for following this plan? Reading food labels  Check food labels for the amount of salt (sodium) per serving. Choose foods with less than 5 percent of the Daily Value of sodium. Generally, foods with less than 300 milligrams (mg) of sodium per serving fit into this eating plan.  To find whole grains, look for the word "whole" as the first word in the ingredient list. Shopping  Buy products labeled as "low-sodium" or "no salt added."  Buy fresh foods. Avoid canned foods and pre-made or frozen meals. Cooking  Avoid adding salt when cooking. Use salt-free seasonings or herbs instead of table salt or sea salt. Check with your health care provider or pharmacist before using salt substitutes.  Do not fry foods. Cook foods using healthy methods such as baking, boiling, grilling, roasting, and broiling instead.  Cook with heart-healthy  oils, such as olive, canola, avocado, soybean, or sunflower oil. Meal planning  Eat a balanced diet that includes: ? 4 or more servings of fruits and 4 or more servings of vegetables each day. Try to fill one-half of your plate with fruits and vegetables. ? 6-8 servings of whole grains each day. ? Less than 6 oz (170 g) of lean meat, poultry, or fish each day. A 3-oz (85-g) serving of meat is about the same size as a deck of cards. One egg equals 1 oz (28 g). ? 2-3 servings of low-fat dairy each day. One serving is 1 cup (237 mL). ? 1 serving of nuts, seeds, or beans 5 times each week. ? 2-3 servings of heart-healthy fats. Healthy fats called omega-3 fatty acids are found in foods such as walnuts, flaxseeds, fortified milks, and eggs. These fats are also found in cold-water fish, such as sardines, salmon, and mackerel.  Limit how much you eat of: ? Canned or prepackaged foods. ? Food that is high in trans fat, such as some fried foods. ? Food that is high in saturated fat, such as fatty meat. ? Desserts and other sweets, sugary drinks, and other foods with added sugar. ? Full-fat dairy products.  Do not salt foods before eating.  Do not eat more than 4 egg yolks a week.  Try to eat at least 2 vegetarian meals a week.  Eat more home-cooked food and less restaurant, buffet, and fast food.   Lifestyle  When eating at a restaurant, ask that your food be prepared with less salt or no salt, if  possible.  If you drink alcohol: ? Limit how much you use to:  0-1 drink a day for women who are not pregnant.  0-2 drinks a day for men. ? Be aware of how much alcohol is in your drink. In the [place] one drink equals one 12 oz bottle of beer (355 mL), one 5 oz glass of wine (148 mL), or one 1 oz glass of hard liquor (44 mL). General information  Avoid eating more than 2,300 mg of salt a day. If you have hypertension, you may need to reduce your sodium intake to 1,500 mg a day.  Work with your  health care provider to maintain a healthy body weight or to lose weight. Ask what an ideal weight is for you.  Get at least 30 minutes of exercise that causes your heart to beat faster (aerobic exercise) most days of the week. Activities may include walking, swimming, or biking.  Work with your health care provider or dietitian to adjust your eating plan to your individual calorie needs. What foods should I eat? Fruits All fresh, dried, or frozen fruit. Canned fruit in natural juice (without added sugar). Vegetables Fresh or frozen vegetables (raw, steamed, roasted, or grilled). Low-sodium or reduced-sodium tomato and vegetable juice. Low-sodium or reduced-sodium tomato sauce and tomato paste. Low-sodium or reduced-sodium canned vegetables. Grains Whole-grain or whole-wheat bread. Whole-grain or whole-wheat pasta. Brown rice. [place]. Bulgur. Whole-grain and low-sodium cereals. Pita bread. Low-fat, low-sodium crackers. Whole-wheat flour tortillas. Meats and other proteins Skinless chicken or [place]. Ground chicken or [place]. Pork with fat trimmed off. Fish and seafood. Egg whites. Dried beans, peas, or lentils. Unsalted nuts, nut butters, and seeds. Unsalted canned beans. Lean cuts of beef with fat trimmed off. Low-sodium, lean precooked or cured meat, such as sausages or meat loaves. Dairy Low-fat (1%) or fat-free (skim) milk. Reduced-fat, low-fat, or fat-free cheeses. Nonfat, low-sodium ricotta or cottage cheese. Low-fat or nonfat yogurt. Low-fat, low-sodium cheese. Fats and oils Soft margarine without trans fats. Vegetable oil. Reduced-fat, low-fat, or light mayonnaise and salad dressings (reduced-sodium). Canola, safflower, olive, avocado, soybean, and sunflower oils. Avocado. Seasonings and condiments Herbs. Spices. Seasoning mixes without salt. Other foods Unsalted popcorn and pretzels. Fat-free sweets. The items listed above may not be a complete list of foods and beverages you  can eat. Contact a dietitian for more information. What foods should I avoid? Fruits Canned fruit in a light or heavy syrup. Fried fruit. Fruit in cream or butter sauce. Vegetables Creamed or fried vegetables. Vegetables in a cheese sauce. Regular canned vegetables (not low-sodium or reduced-sodium). Regular canned tomato sauce and paste (not low-sodium or reduced-sodium). Regular tomato and vegetable juice (not low-sodium or reduced-sodium). Angie Fava. Olives. Grains Baked goods made with fat, such as croissants, muffins, or some breads. Dry pasta or rice meal packs. Meats and other proteins Fatty cuts of meat. Ribs. Fried meat. Berniece Salines. Bologna, salami, and other precooked or cured meats, such as sausages or meat loaves. Fat from the back of a pig (fatback). Bratwurst. Salted nuts and seeds. Canned beans with added salt. Canned or smoked fish. Whole eggs or egg yolks. Chicken or [place] with skin. Dairy Whole or 2% milk, cream, and half-and-half. Whole or full-fat cream cheese. Whole-fat or sweetened yogurt. Full-fat cheese. Nondairy creamers. Whipped toppings. Processed cheese and cheese spreads. Fats and oils Butter. Stick margarine. Lard. Shortening. Ghee. Bacon fat. Tropical oils, such as coconut, palm kernel, or palm oil. Seasonings and condiments Onion salt, garlic salt, seasoned salt, table  salt, and sea salt. Worcestershire sauce. Tartar sauce. Barbecue sauce. Teriyaki sauce. Soy sauce, including reduced-sodium. Steak sauce. Canned and packaged gravies. Fish sauce. Oyster sauce. Cocktail sauce. Store-bought horseradish. Ketchup. Mustard. Meat flavorings and tenderizers. Bouillon cubes. Hot sauces. Pre-made or packaged marinades. Pre-made or packaged taco seasonings. Relishes. Regular salad dressings. Other foods Salted popcorn and pretzels. The items listed above may not be a complete list of foods and beverages you should avoid. Contact a dietitian for more information. Where to find more  information  National Heart, Lung, and Blood Institute: https://wilson-eaton.com/  American Heart Association: www.heart.org  Academy of Nutrition and Dietetics: www.eatright.[place]: www.kidney.org Summary  The DASH eating plan is a healthy eating plan that has been shown to reduce high blood pressure (hypertension). It may also reduce your risk for type 2 diabetes, heart disease, and stroke.  When on the DASH eating plan, aim to eat more fresh fruits and vegetables, whole grains, lean proteins, low-fat dairy, and heart-healthy fats.  With the DASH eating plan, you should limit salt (sodium) intake to 2,300 mg a day. If you have hypertension, you may need to reduce your sodium intake to 1,500 mg a day.  Work with your health care provider or dietitian to adjust your eating plan to your individual calorie needs. This information is not intended to replace advice given to you by your health care provider. Make sure you discuss any questions you have with your health care provider. Document Revised: 10/09/2019 Document Reviewed: 10/09/2019 Elsevier Patient Education  2021 [employer].

## 2020-12-31 DIAGNOSIS — R9431 Abnormal electrocardiogram [ECG] [EKG]: Secondary | ICD-10-CM | POA: Insufficient documentation

## 2020-12-31 NOTE — Assessment & Plan Note (Addendum)
Diffuse T wave inversions. Patient asymptomatic. No strong fmhx CAD. Recheck EKG at f/u visit 02/2021. If persistent abnormality, low threshold to check echo and refer to cards.

## 2021-02-17 ENCOUNTER — Encounter: Payer: Self-pay | Admitting: Family Medicine

## 2021-02-17 ENCOUNTER — Other Ambulatory Visit: Payer: Self-pay

## 2021-02-17 ENCOUNTER — Ambulatory Visit: Payer: BC Managed Care – PPO | Admitting: Family Medicine

## 2021-02-17 VITALS — BP 136/90 | HR 62 | Temp 97.9°F | Ht 66.5 in | Wt 205.5 lb

## 2021-02-17 DIAGNOSIS — I10 Essential (primary) hypertension: Secondary | ICD-10-CM | POA: Diagnosis not present

## 2021-02-17 DIAGNOSIS — R9431 Abnormal electrocardiogram [ECG] [EKG]: Secondary | ICD-10-CM | POA: Diagnosis not present

## 2021-02-17 NOTE — Progress Notes (Signed)
Patient ID: Todd Holt, male    DOB: 1965-12-10, 55 y.o.   MRN: [id number]  This visit was conducted in person.  BP 136/90   Pulse 62   Temp 97.9 F (36.6 C) (Temporal)   Ht 5' 6.5" (1.689 m)   Wt 205 lb 8 oz (93.2 kg)   SpO2 97%   BMI 32.67 kg/m   BP Readings from Last 3 Encounters:  02/17/21 136/90  12/28/20 140/78  12/19/20 (!) 144/101    CC: 6 wk f/u visit  Subjective:   HPI: Todd Holt is a 55 y.o. male presenting on 02/17/2021 for Hypertension (Here for 6 wk f/u.)   HTN - Compliant with current antihypertensive regimen of amlodipine 2.5mg  daily. 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 No low blood pressure readings or symptoms of dizziness/syncope.  Denies HA, vision changes, CP/tightness, SOB, leg swelling.   No known fmhx CAD.  Denies exertional dyspnea or chest tightness, dizziness, palpitations.  Work is physically active (w[employer]ding) but no regular exercise routine      Relevant past medical, surgical, family and social history reviewed and updated as indicated. Interim medical history since our last visit reviewed. Allergies and medications reviewed and updated. Outpatient Medications Prior to Visit  Medication Sig Dispense Refill  . amLODipine (NORVASC) 2.5 MG tablet Take 1 tablet (2.5 mg total) by mouth daily. 30 tablet 6   No facility-administered medications prior to visit.     Per HPI unless specifically indicated in ROS section below Review of Systems Objective:  BP 136/90   Pulse 62   Temp 97.9 F (36.6 C) (Temporal)   Ht 5' 6.5" (1.689 m)   Wt 205 lb 8 oz (93.2 kg)   SpO2 97%   BMI 32.67 kg/m   Wt Readings from Last 3 Encounters:  02/17/21 205 lb 8 oz (93.2 kg)  12/28/20 205 lb 5 oz (93.1 kg)  12/19/20 206 lb (93.4 kg)      Physical Exam Vitals and nursing note reviewed.  Constitutional:      Appearance: Normal appearance. He is not ill-appearing.  Cardiovascular:     Rate and Rhythm: Normal rate and regular  rhythm.     Pulses: Normal pulses.     Heart sounds: Normal heart sounds. No murmur heard.   Pulmonary:     Effort: Pulmonary effort is normal. No respiratory distress.     Breath sounds: Normal breath sounds. No wheezing, rhonchi or rales.  Musculoskeletal:     Right lower leg: No edema.     Left lower leg: No edema.  Neurological:     Mental Status: He is alert.  Psychiatric:        Mood and Affect: Mood normal.        Behavior: Behavior normal.       Lab Results  Component Value Date   CREATININE 1.15 06/28/2020   BUN 12 06/28/2020   NA 140 06/28/2020   K 4.4 06/28/2020   CL 103 06/28/2020   CO2 24 06/28/2020    EKG - sinus bradycardia rate 50s, normal axis, intervals, no hypertrophy. Persistent T wave inversions inferiorly and laterally.  Assessment & Plan:  This visit occurred during the SARS-CoV-2 public health emergency.  Safety protocols were in place, including screening questions prior to the visit, additional usage of staff PPE, and extensive cleaning of exam room while observing appropriate contact time as indicated for disinfecting solutions.   Problem List Items Addressed This Visit  Hypertension - Primary    BP remaining borderline elevated - will increase amlodipine to 5mg  daily. Reassess at CPE.       Relevant Orders   EKG 12-Lead (Completed)   Abnormal EKG    Update EKG today.  T wave inversions persist. Pt asxs. No known fmhx CAD. Will refer to cardiology for further evaluation. Pt agrees with plan.           No orders of the defined types were placed in this encounter.  Orders Placed This Encounter  Procedures  . EKG 12-Lead    Patient Instructions  EKG today  Increase amlodipine to 5mg  daily - double up on what you have at home until you run out, new dose will be at pharmacy.  Return after 06/28/2021 for physical.  We will refer you to [place] for cardiology appointment for persistently abnormal EKG.    Follow up plan: Return in  about 4 months (around 06/28/2021) for annual exam, prior fasting for blood work.  Ria Bush, MD

## 2021-02-17 NOTE — Assessment & Plan Note (Addendum)
Update EKG today.  T wave inversions persist. Pt asxs. No known fmhx CAD. Will refer to cardiology for further evaluation. Pt agrees with plan.

## 2021-02-17 NOTE — Assessment & Plan Note (Signed)
BP remaining borderline elevated - will increase amlodipine to 5mg  daily. Reassess at CPE.

## 2021-02-17 NOTE — Patient Instructions (Addendum)
EKG today  Increase amlodipine to 5mg  daily - double up on what you have at home until you run out, new dose will be at pharmacy.  Return after 06/28/2021 for physical.  We will refer you to [place] for cardiology appointment for persistently abnormal EKG.

## 2021-03-13 ENCOUNTER — Other Ambulatory Visit: Payer: Self-pay

## 2021-03-13 MED ORDER — AMLODIPINE BESYLATE 5 MG PO TABS: 5.0000 mg | ORAL_TABLET | Freq: Every day | ORAL | 0 refills | Status: DC

## 2021-03-13 NOTE — Telephone Encounter (Signed)
Pt called in to report that he no longer had Rx for 2.5 Amlodipine and Rx sent for 5mg  per 02/17/2021 note OV from Dr Darnell Level

## 2021-04-05 ENCOUNTER — Other Ambulatory Visit: Payer: Self-pay | Admitting: Family Medicine

## 2021-04-05 NOTE — Telephone Encounter (Signed)
Pharmacy requests refill on: Amlodipine 5 mg   LAST REFILL: 03/13/2021 (Q-30, R-0) LAST OV: 02/17/2021 NEXT OV: 06/30/2021 PHARMACY: [id number] [place], [place]

## 2021-06-18 ENCOUNTER — Other Ambulatory Visit: Payer: Self-pay | Admitting: Family Medicine

## 2021-06-18 DIAGNOSIS — Z1159 Encounter for screening for other viral diseases: Secondary | ICD-10-CM

## 2021-06-18 DIAGNOSIS — I10 Essential (primary) hypertension: Secondary | ICD-10-CM

## 2021-06-18 DIAGNOSIS — E785 Hyperlipidemia, unspecified: Secondary | ICD-10-CM

## 2021-06-18 DIAGNOSIS — Z125 Encounter for screening for malignant neoplasm of prostate: Secondary | ICD-10-CM

## 2021-06-19 ENCOUNTER — Other Ambulatory Visit: Payer: Self-pay

## 2021-06-19 ENCOUNTER — Other Ambulatory Visit (INDEPENDENT_AMBULATORY_CARE_PROVIDER_SITE_OTHER): Payer: BC Managed Care – PPO

## 2021-06-19 DIAGNOSIS — E785 Hyperlipidemia, unspecified: Secondary | ICD-10-CM | POA: Diagnosis not present

## 2021-06-19 DIAGNOSIS — Z125 Encounter for screening for malignant neoplasm of prostate: Secondary | ICD-10-CM

## 2021-06-19 DIAGNOSIS — Z1159 Encounter for screening for other viral diseases: Secondary | ICD-10-CM

## 2021-06-19 DIAGNOSIS — I10 Essential (primary) hypertension: Secondary | ICD-10-CM

## 2021-06-19 LAB — MICROALBUMIN / CREATININE URINE RATIO
Creatinine,U: 173.3 mg/dL
Microalb Creat Ratio: 0.4 mg/g (ref 0.0–30.0)
Microalb, Ur: <0.7 mg/dL (ref 0.0–1.9)

## 2021-06-19 LAB — COMPREHENSIVE METABOLIC PANEL
ALT: 26 U/L (ref 0–53)
AST: 21 U/L (ref 0–37)
Albumin: 4.4 g/dL (ref 3.5–5.2)
Alkaline Phosphatase: 53 U/L (ref 39–117)
BUN: 11 mg/dL (ref 6–23)
CO2: 25 mEq/L (ref 19–32)
Calcium: 9.5 mg/dL (ref 8.4–10.5)
Chloride: 104 mEq/L (ref 96–112)
Creatinine, Ser: 1.01 mg/dL (ref 0.40–1.50)
GFR: 84.93 mL/min (ref >60.00)
Glucose, Bld: 91 mg/dL (ref 70–99)
Potassium: 4.2 mEq/L (ref 3.5–5.1)
Sodium: 138 mEq/L (ref 135–145)
Total Bilirubin: 0.5 mg/dL (ref 0.2–1.2)
Total Protein: 7.5 g/dL (ref 6.0–8.3)

## 2021-06-19 LAB — LIPID PANEL
Cholesterol: 217 mg/dL — ABNORMAL HIGH (ref 0–200)
HDL: 69.1 mg/dL (ref >39.00)
LDL Cholesterol: 120 mg/dL — ABNORMAL HIGH (ref 0–99)
NonHDL: 147.99
Total CHOL/HDL Ratio: 3
Triglycerides: 140 mg/dL (ref 0.0–149.0)
VLDL: 28 mg/dL (ref 0.0–40.0)

## 2021-06-19 LAB — PSA: PSA: 0.29 ng/mL (ref 0.10–4.00)

## 2021-06-20 LAB — HEPATITIS C ANTIBODY
Hepatitis C Ab: NONREACTIVE
SIGNAL TO CUT-OFF: 0.05 (ref <1.00)

## 2021-06-30 ENCOUNTER — Encounter: Payer: Self-pay | Admitting: Family Medicine

## 2021-06-30 ENCOUNTER — Ambulatory Visit (INDEPENDENT_AMBULATORY_CARE_PROVIDER_SITE_OTHER): Payer: BC Managed Care – PPO | Admitting: Family Medicine

## 2021-06-30 ENCOUNTER — Other Ambulatory Visit: Payer: Self-pay

## 2021-06-30 VITALS — BP 134/86 | HR 77 | Temp 97.7°F | Ht 66.5 in | Wt 205.3 lb

## 2021-06-30 DIAGNOSIS — E669 Obesity, unspecified: Secondary | ICD-10-CM

## 2021-06-30 DIAGNOSIS — Z0000 Encounter for general adult medical examination without abnormal findings: Secondary | ICD-10-CM | POA: Diagnosis not present

## 2021-06-30 DIAGNOSIS — I10 Essential (primary) hypertension: Secondary | ICD-10-CM

## 2021-06-30 DIAGNOSIS — E785 Hyperlipidemia, unspecified: Secondary | ICD-10-CM

## 2021-06-30 DIAGNOSIS — R9431 Abnormal electrocardiogram [ECG] [EKG]: Secondary | ICD-10-CM

## 2021-06-30 MED ORDER — AMLODIPINE BESYLATE 5 MG PO TABS: 5.0000 mg | ORAL_TABLET | Freq: Every day | ORAL | 3 refills | Status: DC

## 2021-06-30 NOTE — Assessment & Plan Note (Signed)
Persistent T wave inversion without symptoms - will refer to cardiology

## 2021-06-30 NOTE — Assessment & Plan Note (Addendum)
Mildly elevated - discussed diet choices to improve LDL. Will defer statin at this time, await cardiology eval. Pt agrees.  The 10-year ASCVD risk score Mikey Bussing DC Brooke Bonito., et al., 2013) is: 10.2%   Values used to calculate the score:     Age: 55 years     Sex: Male     Is Non-Hispanic African American: Yes     Diabetic: No     Tobacco smoker: No     Systolic Blood Pressure: Q000111Q mmHg     Is BP treated: Yes     HDL Cholesterol: 69.1 mg/dL     Total Cholesterol: 217 mg/dL

## 2021-06-30 NOTE — Patient Instructions (Addendum)
Cholesterol levels a bit elevated - work on more fiber in the diet (with good water intake), more legumes.  You are doing well today Continue current medicine amlodipine.  We will refer you to cardiology for evaluation for recent EKG.  Return as needed or in 1 year for next physical.   Health Maintenance, Male Adopting a healthy lifestyle and getting preventive care are important in promoting health and wellness. Ask your health care provider about: The right schedule for you to have regular tests and exams. Things you can do on your own to prevent diseases and keep yourself healthy. What should I know about diet, weight, and exercise? Eat a healthy diet  Eat a diet that includes plenty of vegetables, fruits, low-fat dairy products, and lean protein. Do not eat a lot of foods that are high in solid fats, added sugars, or sodium.  Maintain a healthy weight Body mass index (BMI) is a measurement that can be used to identify possible weight problems. It estimates body fat based on height and weight. Your health care provider can help determine your BMI and help you achieve or maintain ahealthy weight. Get regular exercise Get regular exercise. This is one of the most important things you can do for your health. Most adults should: Exercise for at least 150 minutes each week. The exercise should increase your heart rate and make you sweat (moderate-intensity exercise). Do strengthening exercises at least twice a week. This is in addition to the moderate-intensity exercise. Spend less time sitting. Even light physical activity can be beneficial. Watch cholesterol and blood lipids Have your blood tested for lipids and cholesterol at 53 years of age, then havethis test every 5 years. You may need to have your cholesterol levels checked more often if: Your lipid or cholesterol levels are high. You are older than 53 years of age. You are at high risk for heart disease. What should I know about  cancer screening? Many types of cancers can be detected early and may often be prevented. Depending on your health history and family history, you may need to have cancer screening at various ages. This may include screening for: Colorectal cancer. Prostate cancer. Skin cancer. Lung cancer. What should I know about heart disease, diabetes, and high blood pressure? Blood pressure and heart disease High blood pressure causes heart disease and increases the risk of stroke. This is more likely to develop in people who have high blood pressure readings, are of African descent, or are overweight. Talk with your health care provider about your target blood pressure readings. Have your blood pressure checked: Every 3-5 years if you are 86-79 years of age. Every year if you are 32 years old or older. If you are between the ages of 76 and 66 and are a current or former smoker, ask your health care provider if you should have a one-time screening for abdominal aortic aneurysm (AAA). Diabetes Have regular diabetes screenings. This checks your fasting blood sugar level. Have the screening done: Once every three years after age 74 if you are at a normal weight and have a low risk for diabetes. More often and at a younger age if you are overweight or have a high risk for diabetes. What should I know about preventing infection? Hepatitis B If you have a higher risk for hepatitis B, you should be screened for this virus. Talk with your health care provider to find out if you are at risk forhepatitis B infection. Hepatitis C Blood testing  is recommended for: Everyone born from [birth year] through 1963. Anyone with known risk factors for hepatitis C. Sexually transmitted infections (STIs) You should be screened each year for STIs, including gonorrhea and chlamydia, if: You are sexually active and are younger than 53 years of age. You are older than 52 years of age and your health care provider tells you that you  are at risk for this type of infection. Your sexual activity has changed since you were last screened, and you are at increased risk for chlamydia or gonorrhea. Ask your health care provider if you are at risk. Ask your health care provider about whether you are at high risk for HIV. Your health care provider may recommend a prescription medicine to help prevent HIV infection. If you choose to take medicine to prevent HIV, you should first get tested for HIV. You should then be tested every 3 months for as long as you are taking the medicine. Follow these instructions at home: Lifestyle Do not use any products that contain nicotine or tobacco, such as cigarettes, e-cigarettes, and chewing tobacco. If you need help quitting, ask your health care provider. Do not use street drugs. Do not share needles. Ask your health care provider for help if you need support or information about quitting drugs. Alcohol use Do not drink alcohol if your health care provider tells you not to drink. If you drink alcohol: Limit how much you have to 0-2 drinks a day. Be aware of how much alcohol is in your drink. In the [place] one drink equals one 12 oz bottle of beer (355 mL), one 5 oz glass of wine (148 mL), or one 1 oz glass of hard liquor (44 mL). General instructions Schedule regular health, dental, and eye exams. Stay current with your vaccines. Tell your health care provider if: You often feel depressed. You have ever been abused or do not feel safe at home. Summary Adopting a healthy lifestyle and getting preventive care are important in promoting health and wellness. Follow your health care provider's instructions about healthy diet, exercising, and getting tested or screened for diseases. Follow your health care provider's instructions on monitoring your cholesterol and blood pressure. This information is not intended to replace advice given to you by your health care provider. Make sure you discuss any  questions you have with your healthcare provider. Document Revised: 10/29/2018 Document Reviewed: 10/29/2018 Elsevier Patient Education  2022 [employer].

## 2021-06-30 NOTE — Assessment & Plan Note (Signed)
Encouraged healthy diet and lifestyle to affect sustainable weight loss.  

## 2021-06-30 NOTE — Progress Notes (Signed)
Patient ID: Todd Holt, male    DOB: 12-02-1965, 55 y.o.   MRN: [id number]  This visit was conducted in person.  BP 134/86   Pulse 77   Temp 97.7 F (36.5 C) (Temporal)   Ht 5' 6.5" (1.689 m)   Wt 205 lb 5 oz (93.1 kg)   SpO2 96%   BMI 32.64 kg/m    CC: CPE Subjective:   HPI: Todd Holt is a 55 y.o. male presenting on 06/30/2021 for Annual Exam   HTN - stable period on amlodipine without pedal edema.   Preventative: COLONOSCOPY 04/2019 - TAx2, rpt 7 yrs ([place]) Prostate cancer screening - yearly PSA  Lung cancer screening - not eligible  Flu shot - yearly  [place] 01/2020, 02/2020 Tdap 2014 Monogamous relationship with GF.  Seatbelt use discussed Sunscreen use discussed. No changing moles. 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 Interim medical history since our last visit reviewed. Allergies and medications reviewed and updated. Outpatient Medications Prior to Visit  Medication Sig Dispense Refill   amLODipine (NORVASC) 5 MG tablet TAKE 1 TABLET (5 MG TOTAL) BY MOUTH DAILY. 90 tablet 0   No facility-administered medications prior to visit.     Per HPI unless specifically indicated in ROS section below Review of Systems  Constitutional:  Negative for activity change, appetite change, chills, fatigue, fever and unexpected weight change.  HENT:  Negative for hearing loss.   Eyes:  Negative for visual disturbance.  Respiratory:  Negative for cough, chest tightness, shortness of breath and wheezing.   Cardiovascular:  Negative for chest pain, palpitations and leg swelling.   Gastrointestinal:  Negative for abdominal distention, abdominal pain, blood in stool, constipation, diarrhea, nausea and vomiting.  Genitourinary:  Negative for difficulty urinating and hematuria.  Musculoskeletal:  Negative for arthralgias, myalgias and neck pain.  Skin:  Negative for rash.  Neurological:  Negative for dizziness, seizures, syncope and headaches.  Hematological:  Negative for adenopathy. Does not bruise/bleed easily.  Psychiatric/Behavioral:  Negative for dysphoric mood. The patient is not nervous/anxious.    Objective:  BP 134/86   Pulse 77   Temp 97.7 F (36.5 C) (Temporal)   Ht 5' 6.5" (1.689 m)   Wt 205 lb 5 oz (93.1 kg)   SpO2 96%   BMI 32.64 kg/m   Wt Readings from Last 3 Encounters:  06/30/21 205 lb 5 oz (93.1 kg)  02/17/21 205 lb 8 oz (93.2 kg)  12/28/20 205 lb 5 oz (93.1 kg)      Physical Exam Vitals and nursing note reviewed.  Constitutional:      General: He is not in acute distress.    Appearance: Normal appearance. He is well-developed. He is not ill-appearing.  HENT:     Head: Normocephalic and atraumatic.     Right Ear: Hearing, tympanic membrane, ear canal and external ear normal.     Left Ear: Hearing, tympanic membrane, ear canal and external ear normal.  Eyes:     General: No scleral icterus.    Extraocular Movements: Extraocular movements intact.  Conjunctiva/sclera: Conjunctivae normal.     Pupils: Pupils are equal, round, and reactive to light.  Neck:     Thyroid: No thyroid mass or thyromegaly.  Cardiovascular:     Rate and Rhythm: Normal rate and regular rhythm.     Pulses: Normal pulses.          Radial pulses are 2+ on the right side and 2+ on the left side.     Heart sounds: Normal heart sounds. No murmur heard. Pulmonary:     Effort: Pulmonary effort is normal. No respiratory distress.     Breath sounds: Normal breath sounds. No wheezing, rhonchi or rales.  Abdominal:     General: Bowel sounds are normal. There is no  distension.     Palpations: Abdomen is soft. There is no mass.     Tenderness: There is no abdominal tenderness. There is no guarding or rebound.     Hernia: No hernia is present.  Musculoskeletal:        General: Normal range of motion.     Cervical back: Normal range of motion and neck supple.     Right lower leg: No edema.     Left lower leg: No edema.  Lymphadenopathy:     Cervical: No cervical adenopathy.  Skin:    General: Skin is warm and dry.     Findings: No rash.  Neurological:     General: No focal deficit present.     Mental Status: He is alert and oriented to person, place, and time.  Psychiatric:        Mood and Affect: Mood normal.        Behavior: Behavior normal.        Thought Content: Thought content normal.        Judgment: Judgment normal.      Results for orders placed or performed in visit on 06/19/21  Hepatitis C antibody  Result Value Ref Range   Hepatitis C Ab NON-REACTIVE NON-REACTIVE   SIGNAL TO CUT-OFF 0.05 <1.00  Microalbumin / creatinine urine ratio  Result Value Ref Range   Microalb, Ur <0.7 0.0 - 1.9 mg/dL   Creatinine,U 173.3 mg/dL   Microalb Creat Ratio 0.4 0.0 - 30.0 mg/g  PSA  Result Value Ref Range   PSA 0.29 0.10 - 4.00 ng/mL  Comprehensive metabolic panel  Result Value Ref Range   Sodium 138 135 - 145 mEq/L   Potassium 4.2 3.5 - 5.1 mEq/L   Chloride 104 96 - 112 mEq/L   CO2 25 19 - 32 mEq/L   Glucose, Bld 91 70 - 99 mg/dL   BUN 11 6 - 23 mg/dL   Creatinine, Ser 1.01 0.40 - 1.50 mg/dL   Total Bilirubin 0.5 0.2 - 1.2 mg/dL   Alkaline Phosphatase 53 39 - 117 U/L   AST 21 0 - 37 U/L   ALT 26 0 - 53 U/L   Total Protein 7.5 6.0 - 8.3 g/dL   Albumin 4.4 3.5 - 5.2 g/dL   GFR 84.93 >60.00 mL/min   Calcium 9.5 8.4 - 10.5 mg/dL  Lipid panel  Result Value Ref Range   Cholesterol 217 (H) 0 - 200 mg/dL   Triglycerides 140.0 0.0 - 149.0 mg/dL   HDL 69.10 >39.00 mg/dL   VLDL 28.0 0.0 - 40.0 mg/dL   LDL Cholesterol 120 (H) 0 - 99  mg/dL   Total CHOL/HDL Ratio 3    NonHDL 147.99     Assessment & Plan:  This  visit occurred during the SARS-CoV-2 public health emergency.  Safety protocols were in place, including screening questions prior to the visit, additional usage of staff PPE, and extensive cleaning of exam room while observing appropriate contact time as indicated for disinfecting solutions.   Problem List Items Addressed This Visit     Healthcare maintenance - Primary    Preventative protocols reviewed and updated unless pt declined. Discussed healthy diet and lifestyle.       HLD (hyperlipidemia)    Mildly elevated - discussed diet choices to improve LDL. Will defer statin at this time, await cardiology eval. Pt agrees.  The 10-year ASCVD risk score Mikey Bussing DC Brooke Bonito., et al., 2013) is: 10.2%   Values used to calculate the score:     Age: 81 years     Sex: Male     Is Non-Hispanic African American: Yes     Diabetic: No     Tobacco smoker: No     Systolic Blood Pressure: Q000111Q mmHg     Is BP treated: Yes     HDL Cholesterol: 69.1 mg/dL     Total Cholesterol: 217 mg/dL       Relevant Medications   amLODipine (NORVASC) 5 MG tablet   Hypertension    Chronic, stable. Continue current regimen.       Relevant Medications   amLODipine (NORVASC) 5 MG tablet   Obesity, Class I, BMI 30-34.9    Encouraged healthy diet and lifestyle to affect sustainable weight loss.       Abnormal EKG    Persistent T wave inversion without symptoms - will refer to cardiology       Relevant Orders   Ambulatory referral to Cardiology     Meds ordered this encounter  Medications   amLODipine (NORVASC) 5 MG tablet    Sig: Take 1 tablet (5 mg total) by mouth daily.    Dispense:  90 tablet    Refill:  3   Orders Placed This Encounter  Procedures   Ambulatory referral to Cardiology    Referral Priority:   Routine    Referral Type:   Consultation    Referral Reason:   Specialty Services Required    Requested Specialty:    Cardiology    Number of Visits Requested:   1     Patient instructions: Cholesterol levels a bit elevated - work on more fiber in the diet (with good water intake), more legumes.  You are doing well today Continue current medicine amlodipine.  We will refer you to cardiology for evaluation for recent EKG.  Return as needed or in 1 year for next physical.   Follow up plan: Return in about 1 year (around 06/30/2022) for annual exam, prior fasting for blood work.  Ria Bush, MD

## 2021-06-30 NOTE — Assessment & Plan Note (Signed)
Chronic, stable. Continue current regimen. 

## 2021-06-30 NOTE — Assessment & Plan Note (Signed)
Preventative protocols reviewed and updated unless pt declined. Discussed healthy diet and lifestyle.  

## 2021-09-04 ENCOUNTER — Ambulatory Visit: Payer: BC Managed Care – PPO | Admitting: Cardiology
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
## 2021-09-15 ENCOUNTER — Other Ambulatory Visit: Payer: Self-pay

## 2021-09-15 ENCOUNTER — Encounter: Payer: Self-pay | Admitting: Cardiology

## 2021-09-15 ENCOUNTER — Ambulatory Visit: Payer: BC Managed Care – PPO | Admitting: Cardiology

## 2021-09-15 VITALS — BP 130/82 | HR 71 | Ht 66.5 in | Wt 210.0 lb

## 2021-09-15 DIAGNOSIS — I10 Essential (primary) hypertension: Secondary | ICD-10-CM

## 2021-09-15 DIAGNOSIS — R9431 Abnormal electrocardiogram [ECG] [EKG]: Secondary | ICD-10-CM | POA: Diagnosis not present

## 2021-09-15 DIAGNOSIS — E785 Hyperlipidemia, unspecified: Secondary | ICD-10-CM

## 2021-09-15 NOTE — Patient Instructions (Signed)
Medication Instructions:  Your physician recommends that you continue on your current medications as directed. Please refer to the Current Medication list given to you today.  Testing/Procedures: Your physician has requested that you have an echocardiogram. Echocardiography is a painless test that uses sound waves to create images of your heart. It provides your doctor with information about the size and shape of your heart and how well your heart's chambers and valves are working. This procedure takes approximately one hour. There are no restrictions for this procedure. This will be done at our [facility] location:  [address]  CT coronary calcium score. This test is done at [address]. This is $99 out of pocket.   Coronary CalciumScan A coronary calcium scan is an imaging test used to look for deposits of calcium and other fatty materials (plaques) in the inner lining of the blood vessels of the heart (coronary arteries). These deposits of calcium and plaques can partly clog and narrow the coronary arteries without producing any symptoms or warning signs. This puts a person at risk for a heart attack. This test can detect these deposits before symptoms develop. Tell a health care provider about: Any allergies you have. All medicines you are taking, including vitamins, herbs, eye drops, creams, and over-the-counter medicines. Any problems you or family members have had with anesthetic medicines. Any blood disorders you have. Any surgeries you have had. Any medical conditions you have. Whether you are pregnant or may be pregnant. What are the risks? Generally, this is a safe procedure. However, problems may occur, including: Harm to a pregnant woman and her unborn baby. This test involves the use of radiation. Radiation exposure can be dangerous to a pregnant woman and her unborn baby. If you are pregnant, you generally should not have this procedure  done. Slight increase in the risk of cancer. This is because of the radiation involved in the test. What happens before the procedure? No preparation is needed for this procedure. What happens during the procedure? You will undress and remove any jewelry around your neck or chest. You will put on a hospital gown. Sticky electrodes will be placed on your chest. The electrodes will be connected to an electrocardiogram (ECG) machine to record a tracing of the electrical activity of your heart. A CT scanner will take pictures of your heart. During this time, you will be asked to lie still and hold your breath for 2-3 seconds while a picture of your heart is being taken. The procedure may vary among health care providers and hospitals. What happens after the procedure? You can get dressed. You can return to your normal activities. It is up to you to get the results of your test. Ask your health care provider, or the department that is doing the test, when your results will be ready. Summary A coronary calcium scan is an imaging test used to look for deposits of calcium and other fatty materials (plaques) in the inner lining of the blood vessels of the heart (coronary arteries). Generally, this is a safe procedure. Tell your health care provider if you are pregnant or may be pregnant. No preparation is needed for this procedure. A CT scanner will take pictures of your heart. You can return to your normal activities after the scan is done. This information is not intended to replace advice given to you by your health care provider. Make sure you discuss any questions you have with your  health care provider. Document Released: 05/03/2008 Document Revised: 09/24/2016 Document Reviewed: 09/24/2016 Elsevier Interactive Patient Education  2017 [place]: At [facility], you and your health needs are our priority.  As part of our continuing mission to provide you with exceptional heart  care, we have created designated Provider Care Teams.  These Care Teams include your primary Cardiologist (physician) and Advanced Practice Providers (APPs -  Physician Assistants and Nurse Practitioners) who all work together to provide you with the care you need, when you need it.  We recommend signing up for the patient portal called "MyChart".  Sign up information is provided on this After Visit Summary.  MyChart is used to connect with patients for Virtual Visits (Telemedicine).  Patients are able to view lab/test results, encounter notes, upcoming appointments, etc.  Non-urgent messages can be sent to your provider as well.   To learn more about what you can do with MyChart, go to [email or web address].    Your next appointment:   6 month(s)  The format for your next appointment:   In Person  Provider:   Oswaldo Milian, MD

## 2021-10-16 ENCOUNTER — Other Ambulatory Visit: Payer: Self-pay

## 2021-10-16 ENCOUNTER — Ambulatory Visit (INDEPENDENT_AMBULATORY_CARE_PROVIDER_SITE_OTHER)
Admission: RE | Admit: 2021-10-16 | Discharge: 2021-10-16 | Disposition: A | Discharge status: Home / Self Care | Payer: Self-pay | Source: Ambulatory Visit | Attending: Cardiology | Admitting: Cardiology

## 2021-10-16 DIAGNOSIS — E785 Hyperlipidemia, unspecified: Secondary | ICD-10-CM

## 2021-10-26 ENCOUNTER — Other Ambulatory Visit: Payer: Self-pay

## 2021-10-26 ENCOUNTER — Ambulatory Visit (HOSPITAL_COMMUNITY): Payer: BC Managed Care – PPO | Attending: Cardiology

## 2021-10-26 DIAGNOSIS — R9431 Abnormal electrocardiogram [ECG] [EKG]: Secondary | ICD-10-CM | POA: Diagnosis not present

## 2021-10-26 LAB — ECHOCARDIOGRAM COMPLETE
Area-P 1/2: 4.49 cm2
S' Lateral: 2.9 cm

## 2021-10-26 MED ORDER — PERFLUTREN LIPID MICROSPHERE
1.0000 mL | INTRAVENOUS | Status: AC | PRN
  Administered 2021-10-26: 3 mL via INTRAVENOUS

## 2021-10-30 ENCOUNTER — Encounter: Payer: Self-pay | Admitting: *Deleted

## 2022-01-15 LAB — LIPID PANEL
Cholesterol: 225 — AB (ref 0–200)
HDL: 64 (ref 35–70)
LDL Cholesterol: 148
Triglycerides: 73 (ref 40–160)

## 2022-01-15 LAB — CBC AND DIFFERENTIAL
Hemoglobin: 14.3 (ref 13.5–17.5)
Platelets: 272 (ref 150–399)
WBC: 3.3

## 2022-01-15 LAB — HEMOGLOBIN A1C: Hemoglobin A1C: 6

## 2022-01-15 LAB — HEPATIC FUNCTION PANEL
ALT: 20 (ref 10–40)
AST: 21 (ref 14–40)
Alkaline Phosphatase: 68 (ref 25–125)
Bilirubin, Total: 0.4

## 2022-01-15 LAB — BASIC METABOLIC PANEL
Creatinine: 1 (ref 0.6–1.3)
Potassium: 4.9 (ref 3.4–5.3)
Sodium: 142 (ref 137–147)

## 2022-01-15 LAB — PSA: PSA: 0.5

## 2022-01-19 ENCOUNTER — Encounter: Payer: Self-pay | Admitting: Family Medicine

## 2022-01-19 DIAGNOSIS — R7303 Prediabetes: Secondary | ICD-10-CM | POA: Insufficient documentation

## 2022-06-01 ENCOUNTER — Telehealth: Payer: Self-pay | Admitting: Family Medicine

## 2022-06-01 MED ORDER — AMLODIPINE BESYLATE 5 MG PO TABS: 5.0000 mg | ORAL_TABLET | Freq: Every day | ORAL | 3 refills | Status: DC

## 2022-06-01 NOTE — Telephone Encounter (Signed)
Patient scheduled for CPE and labs.

## 2022-06-01 NOTE — Telephone Encounter (Signed)
E-scribed refill.  Plz schedule CPE and lab visits for additional refills.  

## 2022-06-01 NOTE — Telephone Encounter (Signed)
  Encourage patient to contact the pharmacy for refills or they can request refills through [facility]  Did the patient contact the pharmacy: No   LAST APPOINTMENT DATE: 06/30/21  NEXT APPOINTMENT DATE: N/A  MEDICATION:amLODipine (NORVASC) 5 MG tablet  Is the patient out of medication? No  If not, how much is left? About 7 pills  Is this a 90 day supply:   PHARMAC[id number] [place] Comments: [facility]   Let patient know to contact pharmacy at the end of the day to make sure medication is ready.  Please notify patient to allow 48-72 hours to process

## 2022-06-28 DIAGNOSIS — I10 Essential (primary) hypertension: Secondary | ICD-10-CM | POA: Diagnosis not present

## 2022-06-28 DIAGNOSIS — Z6833 Body mass index (BMI) 33.0-33.9, adult: Secondary | ICD-10-CM | POA: Diagnosis not present

## 2022-06-28 DIAGNOSIS — Z20822 Contact with and (suspected) exposure to covid-19: Secondary | ICD-10-CM | POA: Diagnosis not present

## 2022-06-28 DIAGNOSIS — Z03818 Encounter for observation for suspected exposure to other biological agents ruled out: Secondary | ICD-10-CM | POA: Diagnosis not present

## 2022-07-12 ENCOUNTER — Other Ambulatory Visit: Payer: Self-pay | Admitting: Family Medicine

## 2022-08-13 ENCOUNTER — Encounter: Payer: Self-pay | Admitting: Internal Medicine

## 2022-08-13 ENCOUNTER — Ambulatory Visit: Payer: BC Managed Care – PPO | Admitting: Internal Medicine

## 2022-08-13 VITALS — BP 114/80 | HR 70 | Temp 97.8°F | Ht 66.5 in | Wt 206.0 lb

## 2022-08-13 DIAGNOSIS — Z23 Encounter for immunization: Secondary | ICD-10-CM

## 2022-08-13 DIAGNOSIS — S161XXA Strain of muscle, fascia and tendon at neck level, initial encounter: Secondary | ICD-10-CM

## 2022-08-13 DIAGNOSIS — S161XXD Strain of muscle, fascia and tendon at neck level, subsequent encounter: Secondary | ICD-10-CM | POA: Insufficient documentation

## 2022-08-13 NOTE — Addendum Note (Signed)
Addended by: Pilar Grammes on: 08/13/2022 04:32 PM   Modules accepted: Orders
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
## 2022-08-13 NOTE — Assessment & Plan Note (Signed)
Localized to SCM Reassured--doesn't seem to be something serious Discussed heat wrap Ice after work/gym if pain Try topical diclofenac

## 2022-08-13 NOTE — Patient Instructions (Signed)
Please try a heat wrap before work or the gym or when it feels tight. If it hurts after work or the gym, try ice for 5-10 minutes. Try over the counter diclofenac gel three times a day for the next week or so.

## 2022-08-19 ENCOUNTER — Other Ambulatory Visit: Payer: Self-pay | Admitting: Family Medicine

## 2022-08-19 DIAGNOSIS — R7303 Prediabetes: Secondary | ICD-10-CM

## 2022-08-19 DIAGNOSIS — I10 Essential (primary) hypertension: Secondary | ICD-10-CM

## 2022-08-19 DIAGNOSIS — Z125 Encounter for screening for malignant neoplasm of prostate: Secondary | ICD-10-CM

## 2022-08-19 DIAGNOSIS — E785 Hyperlipidemia, unspecified: Secondary | ICD-10-CM

## 2022-08-21 ENCOUNTER — Other Ambulatory Visit (INDEPENDENT_AMBULATORY_CARE_PROVIDER_SITE_OTHER): Payer: BC Managed Care – PPO

## 2022-08-21 DIAGNOSIS — E785 Hyperlipidemia, unspecified: Secondary | ICD-10-CM | POA: Diagnosis not present

## 2022-08-21 DIAGNOSIS — R7303 Prediabetes: Secondary | ICD-10-CM | POA: Diagnosis not present

## 2022-08-21 DIAGNOSIS — Z125 Encounter for screening for malignant neoplasm of prostate: Secondary | ICD-10-CM

## 2022-08-21 DIAGNOSIS — I10 Essential (primary) hypertension: Secondary | ICD-10-CM

## 2022-08-22 LAB — MICROALBUMIN / CREATININE URINE RATIO
Creatinine,U: 149.6 mg/dL
Microalb Creat Ratio: 0.5 mg/g (ref 0.0–30.0)
Microalb, Ur: <0.7 mg/dL (ref 0.0–1.9)

## 2022-08-22 LAB — COMPREHENSIVE METABOLIC PANEL WITH GFR
ALT: 22 U/L (ref 0–53)
AST: 30 U/L (ref 0–37)
Albumin: 4.4 g/dL (ref 3.5–5.2)
Alkaline Phosphatase: 58 U/L (ref 39–117)
BUN: 13 mg/dL (ref 6–23)
CO2: 27 meq/L (ref 19–32)
Calcium: 9.2 mg/dL (ref 8.4–10.5)
Chloride: 102 meq/L (ref 96–112)
Creatinine, Ser: 1.12 mg/dL (ref 0.40–1.50)
GFR: 74.41 mL/min
Glucose, Bld: 89 mg/dL (ref 70–99)
Potassium: 4 meq/L (ref 3.5–5.1)
Sodium: 136 meq/L (ref 135–145)
Total Bilirubin: 0.7 mg/dL (ref 0.2–1.2)
Total Protein: 6.9 g/dL (ref 6.0–8.3)

## 2022-08-22 LAB — LIPID PANEL
Cholesterol: 197 mg/dL (ref 0–200)
HDL: 64.4 mg/dL (ref >39.00)
LDL Cholesterol: 121 mg/dL — ABNORMAL HIGH (ref 0–99)
NonHDL: 132.81
Total CHOL/HDL Ratio: 3
Triglycerides: 59 mg/dL (ref 0.0–149.0)
VLDL: 11.8 mg/dL (ref 0.0–40.0)

## 2022-08-22 LAB — HEMOGLOBIN A1C: Hgb A1c MFr Bld: 6.1 % (ref 4.6–6.5)

## 2022-08-22 LAB — PSA: PSA: 0.29 ng/mL (ref 0.10–4.00)

## 2022-08-28 ENCOUNTER — Encounter: Payer: Self-pay | Admitting: Family Medicine

## 2022-08-28 ENCOUNTER — Ambulatory Visit (INDEPENDENT_AMBULATORY_CARE_PROVIDER_SITE_OTHER): Payer: BC Managed Care – PPO | Admitting: Family Medicine

## 2022-08-28 VITALS — BP 130/80 | HR 68 | Temp 97.4°F | Ht 66.0 in | Wt 202.0 lb

## 2022-08-28 DIAGNOSIS — R9431 Abnormal electrocardiogram [ECG] [EKG]: Secondary | ICD-10-CM

## 2022-08-28 DIAGNOSIS — Z0000 Encounter for general adult medical examination without abnormal findings: Secondary | ICD-10-CM | POA: Diagnosis not present

## 2022-08-28 DIAGNOSIS — I10 Essential (primary) hypertension: Secondary | ICD-10-CM | POA: Diagnosis not present

## 2022-08-28 DIAGNOSIS — E785 Hyperlipidemia, unspecified: Secondary | ICD-10-CM

## 2022-08-28 DIAGNOSIS — R7303 Prediabetes: Secondary | ICD-10-CM

## 2022-08-28 DIAGNOSIS — E669 Obesity, unspecified: Secondary | ICD-10-CM

## 2022-08-28 DIAGNOSIS — S161XXD Strain of muscle, fascia and tendon at neck level, subsequent encounter: Secondary | ICD-10-CM

## 2022-08-28 DIAGNOSIS — E66811 Obesity, class 1: Secondary | ICD-10-CM

## 2022-08-28 MED ORDER — AMLODIPINE BESYLATE 5 MG PO TABS: 5.0000 mg | ORAL_TABLET | Freq: Every day | ORAL | 3 refills | Status: DC

## 2022-08-28 MED ORDER — CYCLOBENZAPRINE HCL 10 MG PO TABS: 5.0000 mg | ORAL_TABLET | Freq: Two times a day (BID) | ORAL | 1 refills | Status: DC | PRN

## 2022-08-28 NOTE — Assessment & Plan Note (Signed)
Chronic, stable. Continue current regimen. 

## 2022-08-28 NOTE — Progress Notes (Signed)
Patient ID: Todd [place], male    DOB: 18-Oct-1966, 56 y.o.   MRN: [id number]  This visit was conducted in person.  BP 130/80 (BP Location: Right Arm, Cuff Size: Large)   Pulse 68   Temp (!) 97.4 F (36.3 C)   Ht '5\' 6"'$  (1.676 m)   Wt 202 lb (91.6 kg)   SpO2 97%   BMI 32.60 kg/m    CC: CPE Subjective:   HPI: Todd [place] is a 56 y.o. male presenting on 08/28/2022 for Annual Exam (Saw Dr Silvio Pate 9-25 for neck strain. Still having a little trouble with that. Helps care for his disabled brother.)   Saw cardiology last year for abnormal EKG - T wave inversions in inferior leads and V3-6. Echocardiogram returned reassuringly normal as did coronary calcium score of zero. He did have some ascending thoracic aortic ATH.   Saw Dr Silvio Pate 2 wks ago with L SCM strain - treating with heat wrap, topical voltaren with benefit. He's been using sauna with benefit.   He's been working out daily since June. Goes to [employer].  Doesn't check BP at home. Nurse can check it at work.  May be lactose intolerant.   Preventative: COLONOSCOPY 04/2019 - TAx2, rpt 7 yrs ([facility])  Prostate cancer screening - yearly PSA  Lung cancer screening - not eligible  Flu shot - yearly  [place] 01/2020, 02/2020, no booster Tdap 2014 Shingrix - dicsussed  Seatbelt use discussed Sunscreen use discussed. No changing moles. 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 Interim medical history since our last visit reviewed. Allergies and medications reviewed and updated. Outpatient Medications Prior to Visit  Medication Sig  Dispense Refill   amLODipine (NORVASC) 5 MG tablet Take 1 tablet (5 mg total) by mouth daily. 90 tablet 3   No facility-administered medications prior to visit.     Per HPI unless specifically indicated in ROS section below Review of Systems  Constitutional:  Negative for activity change, appetite change, chills, fatigue, fever and unexpected weight change.  HENT:  Negative for hearing loss.   Eyes:  Negative for visual disturbance.  Respiratory:  Negative for cough, chest tightness, shortness of breath and wheezing.   Cardiovascular:  Negative for chest pain, palpitations and leg swelling.  Gastrointestinal:  Negative for abdominal distention, abdominal pain, blood in stool, constipation, diarrhea, nausea and vomiting.  Genitourinary:  Negative for difficulty urinating and hematuria.  Musculoskeletal:  Negative for arthralgias, myalgias and neck pain.  Skin:  Negative for rash.  Neurological:  Negative for dizziness, seizures, syncope and headaches.  Hematological:  Negative for adenopathy. Does not bruise/bleed easily.  Psychiatric/Behavioral:  Negative for dysphoric mood. The patient is not nervous/anxious.     Objective:  BP 130/80 (BP Location: Right Arm, Cuff Size: Large)   Pulse 68   Temp (!) 97.4 F (36.3 C)   Ht '5\' 6"'$  (1.676 m)   Wt 202 lb (91.6 kg)   SpO2 97%   BMI 32.60 kg/m   Wt Readings from Last 3 Encounters:  08/28/22 202 lb (91.6 kg)  08/13/22 206 lb (93.4 kg)  09/15/21 210 lb (95.3 kg)      Physical Exam Vitals and nursing note reviewed.  Constitutional:      General: He is not in acute distress.    Appearance: Normal appearance. He is well-developed. He is not ill-appearing.  HENT:     Head: Normocephalic and atraumatic.     Right Ear: Hearing, tympanic membrane, ear canal and external ear normal.     Left Ear: Hearing, tympanic membrane, ear canal and external ear normal.  Eyes:     General: No scleral icterus.    Extraocular Movements: Extraocular  movements intact.     Conjunctiva/sclera: Conjunctivae normal.     Pupils: Pupils are equal, round, and reactive to light.  Neck:     Thyroid: No thyroid mass or thyromegaly.  Cardiovascular:     Rate and Rhythm: Normal rate and regular rhythm.     Pulses: Normal pulses.          Radial pulses are 2+ on the right side and 2+ on the left side.     Heart sounds: Normal heart sounds. No murmur heard. Pulmonary:     Effort: Pulmonary effort is normal. No respiratory distress.     Breath sounds: Normal breath sounds. No wheezing, rhonchi or rales.  Abdominal:     General: Bowel sounds are normal. There is no distension.     Palpations: Abdomen is soft. There is no mass.     Tenderness: There is no abdominal tenderness. There is no guarding or rebound.     Hernia: No hernia is present.  Musculoskeletal:        General: Normal range of motion.     Cervical back: Normal range of motion and neck supple.     Right lower leg: No edema.     Left lower leg: No edema.  Lymphadenopathy:     Cervical: No cervical adenopathy.  Skin:    General: Skin is warm and dry.     Findings: No rash.  Neurological:     General: No focal deficit present.     Mental Status: He is alert and oriented to person, place, and time.  Psychiatric:        Mood and Affect: Mood normal.        Behavior: Behavior normal.        Thought Content: Thought content normal.        Judgment: Judgment normal.       Results for orders placed or performed in visit on 08/21/22  Microalbumin / creatinine urine ratio  Result Value Ref Range   Microalb, Ur <0.7 0.0 - 1.9 mg/dL   Creatinine,U 149.6 mg/dL   Microalb Creat Ratio 0.5 0.0 - 30.0 mg/g  PSA  Result Value Ref Range   PSA 0.29 0.10 - 4.00 ng/mL  Hemoglobin A1c  Result Value Ref Range   Hgb A1c MFr Bld 6.1 4.6 - 6.5 %  Comprehensive metabolic panel  Result Value Ref Range   Sodium 136 135 - 145 mEq/L   Potassium 4.0 3.5 - 5.1 mEq/L   Chloride 102 96 - 112 mEq/L    CO2 27 19 - 32 mEq/L   Glucose, Bld 89 70 - 99 mg/dL   BUN 13 6 - 23 mg/dL   Creatinine, Ser 1.12 0.40 - 1.50 mg/dL   Total Bilirubin 0.7 0.2 - 1.2 mg/dL   Alkaline Phosphatase 58 39 - 117 U/L   AST 30 0 - 37  U/L   ALT 22 0 - 53 U/L   Total Protein 6.9 6.0 - 8.3 g/dL   Albumin 4.4 3.5 - 5.2 g/dL   GFR 74.41 >60.00 mL/min   Calcium 9.2 8.4 - 10.5 mg/dL  Lipid panel  Result Value Ref Range   Cholesterol 197 0 - 200 mg/dL   Triglycerides 59.0 0.0 - 149.0 mg/dL   HDL 64.40 >39.00 mg/dL   VLDL 11.8 0.0 - 40.0 mg/dL   LDL Cholesterol 121 (H) 0 - 99 mg/dL   Total CHOL/HDL Ratio 3    NonHDL 132.81     Assessment & Plan:   Problem List Items Addressed This Visit     Healthcare maintenance - Primary (Chronic)    Preventative protocols reviewed and updated unless pt declined. Discussed healthy diet and lifestyle.       HLD (hyperlipidemia)    Chronic, improved control with healthy diet and lifestyle changes - congratulated. Recent calcium score of zero was very reassuring as well. No need for medication at this time.  The 10-year ASCVD risk score (Arnett DK, et al., 2019) is: 10%   Values used to calculate the score:     Age: 77 years     Sex: Male     Is Non-Hispanic African American: Yes     Diabetic: No     Tobacco smoker: No     Systolic Blood Pressure: 325 mmHg     Is BP treated: Yes     HDL Cholesterol: 64.4 mg/dL     Total Cholesterol: 197 mg/dL       Relevant Medications   amLODipine (NORVASC) 5 MG tablet   Hypertension    Chronic, stable. Continue current regimen.       Relevant Medications   amLODipine (NORVASC) 5 MG tablet   Obesity, Class I, BMI 30-34.9    Congratulated on weight loss to date. He is motivated to continue healthy choices including daily working out at gym.       Abnormal EKG   Prediabetes    Encouraged limiting added sugar ,sweetened beverages.       Neck strain, subsequent encounter    Ongoing. Discussed ice/heat, topical NSAID,  provided with neck spasm exercises from [facility] pt advisor. Rx flexeril with sedation precautions.         Meds ordered this encounter  Medications   amLODipine (NORVASC) 5 MG tablet    Sig: Take 1 tablet (5 mg total) by mouth daily.    Dispense:  90 tablet    Refill:  3   cyclobenzaprine (FLEXERIL) 10 MG tablet    Sig: Take 0.5-1 tablets (5-10 mg total) by mouth 2 (two) times daily as needed for muscle spasms.    Dispense:  30 tablet    Refill:  1   No orders of the defined types were placed in this encounter.   Patient instructions: Check with pharmacy about shingles shot.  Schedule eye exam if you're due.  Congratulations on health diet and lifestyle changes! Good to see you today Return as needed or in 1 year for next physical.  Follow up plan: Return in about 1 year (around 08/29/2023) for annual exam, prior fasting for blood work.  Ria Bush, MD

## 2022-08-28 NOTE — Assessment & Plan Note (Signed)
Chronic, improved control with healthy diet and lifestyle changes - congratulated. Recent calcium score of zero was very reassuring as well. No need for medication at this time.  The 10-year ASCVD risk score (Arnett DK, et al., 2019) is: 10%   Values used to calculate the score:     Age: 56 years     Sex: Male     Is Non-Hispanic African American: Yes     Diabetic: No     Tobacco smoker: No     Systolic Blood Pressure: 922 mmHg     Is BP treated: Yes     HDL Cholesterol: 64.4 mg/dL     Total Cholesterol: 197 mg/dL

## 2022-08-28 NOTE — Assessment & Plan Note (Signed)
Preventative protocols reviewed and updated unless pt declined. Discussed healthy diet and lifestyle.  

## 2022-08-28 NOTE — Assessment & Plan Note (Signed)
Encouraged limiting added sugar ,sweetened beverages.

## 2022-08-28 NOTE — Assessment & Plan Note (Signed)
Ongoing. Discussed ice/heat, topical NSAID, provided with neck spasm exercises from [facility] pt advisor. Rx flexeril with sedation precautions.

## 2022-08-28 NOTE — Patient Instructions (Addendum)
Check with pharmacy about shingles shot.  Schedule eye exam if you're due.  Congratulations on health diet and lifestyle changes! Good to see you today Return as needed or in 1 year for next physical.  Health Maintenance, Male Adopting a healthy lifestyle and getting preventive care are important in promoting health and wellness. Ask your health care provider about: The right schedule for you to have regular tests and exams. Things you can do on your own to prevent diseases and keep yourself healthy. What should I know about diet, weight, and exercise? Eat a healthy diet  Eat a diet that includes plenty of vegetables, fruits, low-fat dairy products, and lean protein. Do not eat a lot of foods that are high in solid fats, added sugars, or sodium. Maintain a healthy weight Body mass index (BMI) is a measurement that can be used to identify possible weight problems. It estimates body fat based on height and weight. Your health care provider can help determine your BMI and help you achieve or maintain a healthy weight. Get regular exercise Get regular exercise. This is one of the most important things you can do for your health. Most adults should: Exercise for at least 150 minutes each week. The exercise should increase your heart rate and make you sweat (moderate-intensity exercise). Do strengthening exercises at least twice a week. This is in addition to the moderate-intensity exercise. Spend less time sitting. Even light physical activity can be beneficial. Watch cholesterol and blood lipids Have your blood tested for lipids and cholesterol at 54 years of age, then have this test every 5 years. You may need to have your cholesterol levels checked more often if: Your lipid or cholesterol levels are high. You are older than 54 years of age. You are at high risk for heart disease. What should I know about cancer screening? Many types of cancers can be detected early and may often be  prevented. Depending on your health history and family history, you may need to have cancer screening at various ages. This may include screening for: Colorectal cancer. Prostate cancer. Skin cancer. Lung cancer. What should I know about heart disease, diabetes, and high blood pressure? Blood pressure and heart disease High blood pressure causes heart disease and increases the risk of stroke. This is more likely to develop in people who have high blood pressure readings or are overweight. Talk with your health care provider about your target blood pressure readings. Have your blood pressure checked: Every 3-5 years if you are 47-43 years of age. Every year if you are 31 years old or older. If you are between the ages of 101 and 1 and are a current or former smoker, ask your health care provider if you should have a one-time screening for abdominal aortic aneurysm (AAA). Diabetes Have regular diabetes screenings. This checks your fasting blood sugar level. Have the screening done: Once every three years after age 20 if you are at a normal weight and have a low risk for diabetes. More often and at a younger age if you are overweight or have a high risk for diabetes. What should I know about preventing infection? Hepatitis B If you have a higher risk for hepatitis B, you should be screened for this virus. Talk with your health care provider to find out if you are at risk for hepatitis B infection. Hepatitis C Blood testing is recommended for: Everyone born from [birth year] through 1963. Anyone with known risk factors for hepatitis C. Sexually transmitted  infections (STIs) You should be screened each year for STIs, including gonorrhea and chlamydia, if: You are sexually active and are younger than 54 years of age. You are older than 54 years of age and your health care provider tells you that you are at risk for this type of infection. Your sexual activity has changed since you were last screened,  and you are at increased risk for chlamydia or gonorrhea. Ask your health care provider if you are at risk. Ask your health care provider about whether you are at high risk for HIV. Your health care provider may recommend a prescription medicine to help prevent HIV infection. If you choose to take medicine to prevent HIV, you should first get tested for HIV. You should then be tested every 3 months for as long as you are taking the medicine. Follow these instructions at home: Alcohol use Do not drink alcohol if your health care provider tells you not to drink. If you drink alcohol: Limit how much you have to 0-2 drinks a day. Know how much alcohol is in your drink. In the [place] one drink equals one 12 oz bottle of beer (355 mL), one 5 oz glass of wine (148 mL), or one 1 oz glass of hard liquor (44 mL). Lifestyle Do not use any products that contain nicotine or tobacco. These products include cigarettes, chewing tobacco, and vaping devices, such as e-cigarettes. If you need help quitting, ask your health care provider. Do not use street drugs. Do not share needles. Ask your health care provider for help if you need support or information about quitting drugs. General instructions Schedule regular health, dental, and eye exams. Stay current with your vaccines. Tell your health care provider if: You often feel depressed. You have ever been abused or do not feel safe at home. Summary Adopting a healthy lifestyle and getting preventive care are important in promoting health and wellness. Follow your health care provider's instructions about healthy diet, exercising, and getting tested or screened for diseases. Follow your health care provider's instructions on monitoring your cholesterol and blood pressure. This information is not intended to replace advice given to you by your health care provider. Make sure you discuss any questions you have with your health care provider. Document Revised:  03/27/2021 Document Reviewed: 03/27/2021 Elsevier Patient Education  [place].

## 2022-08-28 NOTE — Assessment & Plan Note (Signed)
Congratulated on weight loss to date. He is motivated to continue healthy choices including daily working out at gym.

## 2022-12-17 ENCOUNTER — Ambulatory Visit (INDEPENDENT_AMBULATORY_CARE_PROVIDER_SITE_OTHER)
Admission: RE | Admit: 2022-12-17 | Discharge: 2022-12-17 | Disposition: A | Discharge status: Home / Self Care | Payer: BC Managed Care – PPO | Source: Ambulatory Visit | Attending: Family Medicine | Admitting: Family Medicine

## 2022-12-17 ENCOUNTER — Ambulatory Visit: Payer: BC Managed Care – PPO | Admitting: Family Medicine

## 2022-12-17 VITALS — BP 120/78 | HR 63 | Temp 97.6°F | Ht 66.0 in | Wt 203.5 lb

## 2022-12-17 DIAGNOSIS — M25562 Pain in left knee: Secondary | ICD-10-CM

## 2022-12-17 MED ORDER — CELECOXIB 200 MG PO CAPS: 200.0000 mg | ORAL_CAPSULE | Freq: Every day | ORAL | 2 refills | Status: DC

## 2022-12-17 NOTE — Progress Notes (Unsigned)
    Todd Garringer T. Apollonia Amini, MD, [place] at [facility] [place], [address]  Phone: [phone]  FAX: [phone]  Todd [place] - 57 y.o. male  MRN [id number]  Date of Birth: Jul 04, 1966  Date: 12/17/2022  PCP: Ria Bush, MD  Referral: Ria Bush, MD  Chief Complaint  Patient presents with   Knee Pain    Left   Subjective:   Todd [place] is a 57 y.o. very pleasant male patient with Body mass index is 32.85 kg/m. who presents with the following:  Pleasant 57 year old gentleman presents with ongoing left-sided knee pain.  Left knee has been hurting and 7 months has been going on.  Works Brewing technologist all day.   No old knee injuries.   Works out every day.   Note for travel  Review of Systems is noted in the HPI, as appropriate  Objective:   BP 120/78   Pulse 63   Temp 97.6 F (36.4 C) (Temporal)   Ht '5\' 6"'$  (1.676 m)   Wt 203 lb 8 oz (92.3 kg)   SpO2 97%   BMI 32.85 kg/m   GEN: No acute distress; alert,appropriate. PULM: Breathing comfortably in no respiratory distress PSYCH: Normally interactive.   Laboratory and Imaging Data:  Assessment and Plan:   ***

## 2022-12-18 ENCOUNTER — Encounter: Payer: Self-pay | Admitting: Family Medicine

## 2022-12-26 ENCOUNTER — Encounter: Payer: Self-pay | Admitting: Family Medicine

## 2022-12-26 ENCOUNTER — Ambulatory Visit: Payer: BC Managed Care – PPO | Admitting: Family Medicine

## 2022-12-26 VITALS — BP 132/82 | HR 71 | Temp 97.9°F | Ht 66.0 in | Wt 200.2 lb

## 2022-12-26 DIAGNOSIS — M25562 Pain in left knee: Secondary | ICD-10-CM | POA: Diagnosis not present

## 2022-12-26 MED ORDER — TRIAMCINOLONE ACETONIDE 40 MG/ML IJ SUSP
40.0000 mg | Freq: Once | INTRAMUSCULAR | Status: AC
  Administered 2022-12-26: 40 mg via INTRA_ARTICULAR

## 2022-12-26 NOTE — Progress Notes (Signed)
Todd Holt T. Todd Brier, MD, [place] at [facility] [place], [address]  Phone: [phone]  FAX: [phone]  Todd [place] - 57 y.o. male  MRN [id number]  Date of Birth: [birth year]  Date: 12/26/2022  PCP: Ria Bush, MD  Referral: Ria Bush, MD  Chief Complaint  Patient presents with   Knee Pain    Left   Subjective:   Todd Holt is a 57 y.o. very pleasant male patient with Body mass index is 32.32 kg/m. who presents with the following:  F/u L knee, I saw him on 12/17/2022 and his knee looked fairly benign.   Returns to discuss, since he is still having pain and pain when he is trying to work.  He does have a small loose body on xray.   He thinks that his knee has intervally worsened some and he does have a deep dull ache at times.  He has not had any locking up of the joint or symptomatic giving way.  He has not had any additional injury compared to the last time I saw him.  He does have medial and lateral joint line tenderness to a mild degree.  Review of Systems is noted in the HPI, as appropriate  Objective:   BP 132/82   Pulse 71   Temp 97.9 F (36.6 C) (Temporal)   Ht 5' 6"$  (1.676 m)   Wt 200 lb 4 oz (90.8 kg)   SpO2 96%   BMI 32.32 kg/m   GEN: No acute distress; alert,appropriate. PULM: Breathing comfortably in no respiratory distress PSYCH: Normally interactive.   Left knee: Full extension.  Flexion to 125.  Stable to varus and valgus stress.  ACL and PCL are intact. Mild medial greater than lateral joint line tenderness Nontender at the pes bursa, quad tendon, patellar tendon. Mild pain with McMurray's.  Neurovascularly intact.  Laboratory and Imaging Data:  Assessment and Plan:     ICD-10-CM   1. Acute pain of left knee  M25.562 triamcinolone acetonide (KENALOG-40) injection 40 mg     Ongoing intermittent knee pain.  Failure of conservative measures thus  far.  I am doubtful of the clinical significance of the small loose body, but this cannot be excluded.  For symptomatic relief of symptoms, we are going to do an intra-articular injection today to see if this will get him over the hump of pain.  Aspiration/Injection Procedure Note Todd [place] November 08, 1966 Date of procedure: 12/26/2022  Procedure: Large Joint Aspiration / Injection of Knee, L Indications: Pain  Procedure Details Patient verbally consented to procedure. Risks, benefits, and alternatives explained. Sterilely prepped with Chloraprep. Ethyl cholride used for anesthesia. 9 cc Lidocaine 1% mixed with 1 mL of Kenalog 40 mg injected using the anteromedial approach without difficulty. No complications with procedure and tolerated well. Patient had decreased pain post-injection. Medication: 1 mL of Kenalog 40 mg   Medication Management during today's office visit: Meds ordered this encounter  Medications   triamcinolone acetonide (KENALOG-40) injection 40 mg   There are no discontinued medications.  Orders placed today for conditions managed today: No orders of the defined types were placed in this encounter.   Disposition: No follow-ups on file.  Dr[employer] One speech-to-text software was used for transcription in this dictation.  Possible transcriptional errors can occur using Editor, commissioning.   Signed,  Maud Deed. Essam Lowdermilk, MD   Outpatient Encounter Medications as of 12/26/2022  Medication Sig  amLODipine (NORVASC) 5 MG tablet Take 1 tablet (5 mg total) by mouth daily.   celecoxib (CELEBREX) 200 MG capsule Take 1 capsule (200 mg total) by mouth daily.   cyclobenzaprine (FLEXERIL) 10 MG tablet Take 0.5-1 tablets (5-10 mg total) by mouth 2 (two) times daily as needed for muscle spasms.   [EXPIRED] triamcinolone acetonide (KENALOG-40) injection 40 mg    No facility-administered encounter medications on file as of 12/26/2022.

## 2023-02-19 IMAGING — CT CT CARDIAC CORONARY ARTERY CALCIUM SCORE
3 series · 14 of 20 positions shown, 16 images · non-contrast
Comparison: None.
COMPARISON: None.

Addendum:
EXAM:
OVER-READ INTERPRETATION  CT CHEST

The following report is an over-read performed by radiologist Dr.
Jocelita Herbst [REDACTED] on 10/17/2021. This
over-read does not include interpretation of cardiac or coronary
anatomy or pathology. The coronary calcium score interpretation by
the cardiologist is attached.
CLINICAL DATA: Cardiovascular disease risk stratification
HLD
CT Coronary Calcium Score
TECHNIQUE: A gated, non-contrast computed tomography scan of the heart was
performed using 3mm slice thickness. Axial images were analyzed on a
dedicated workstation. Calcium scoring of the coronary arteries was
performed using the Agatston method.

[Series 2: cascseq 2.0 sa36 70% (id) · axial · 0.39mm/px · z∈[-231,-151]mm · 4 of 68 slices shown]
[im 14/68  vessel]
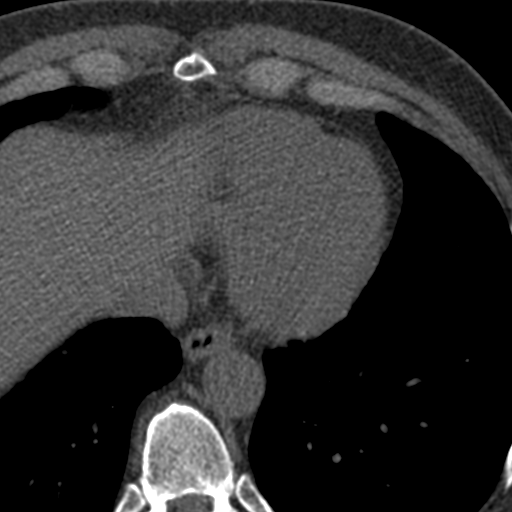
[im 27/68  vessel]
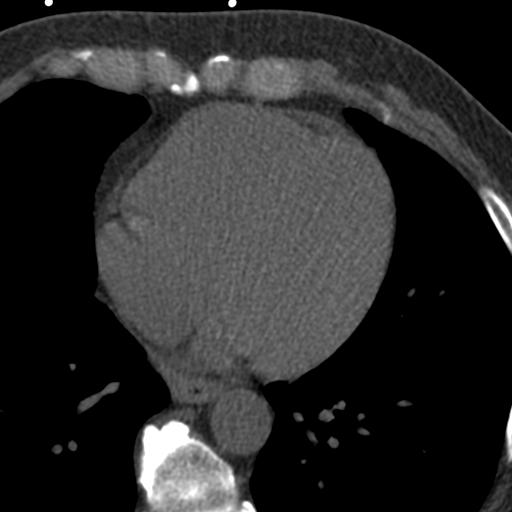
[im 41/68  vessel]
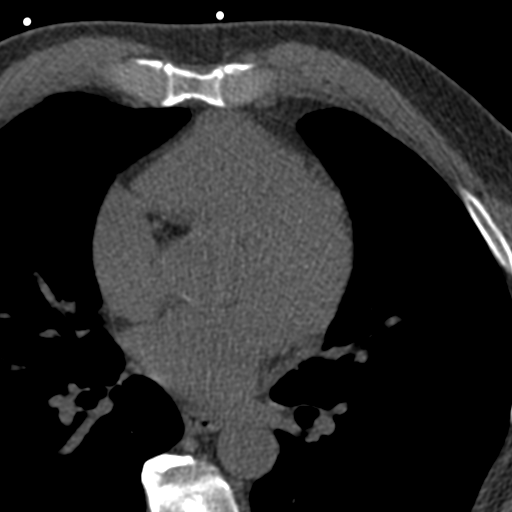
[im 54/68  vessel]
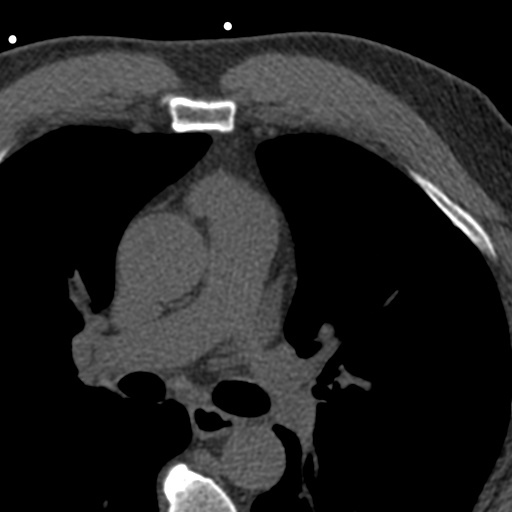

[Series 3: cascseq 2.0 bf37 st · axial · 0.71mm/px · z∈[-235,-147]mm · 5 of 68 slices shown, 7 images]
[im 12/68  vessel]
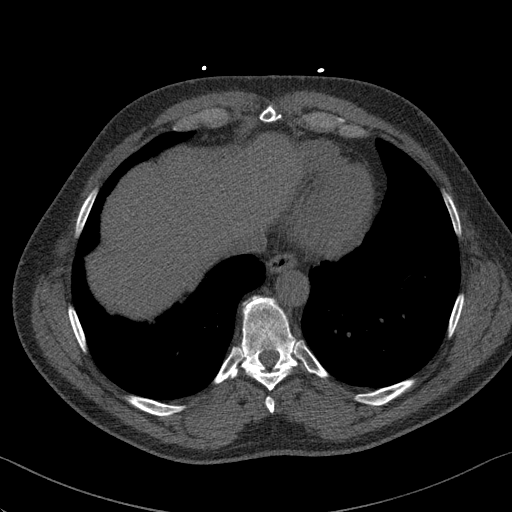
[im 12/68  lung]
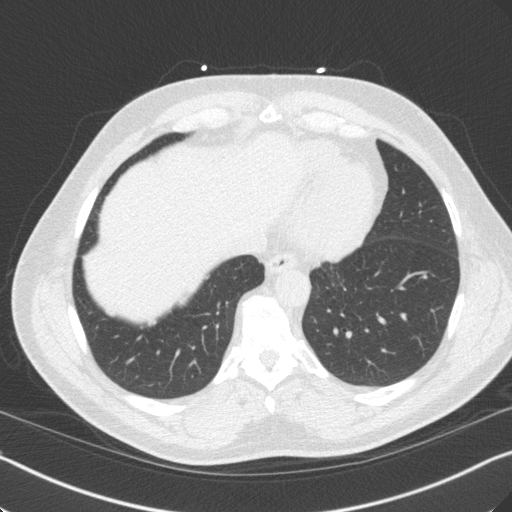
[im 23/68  vessel]
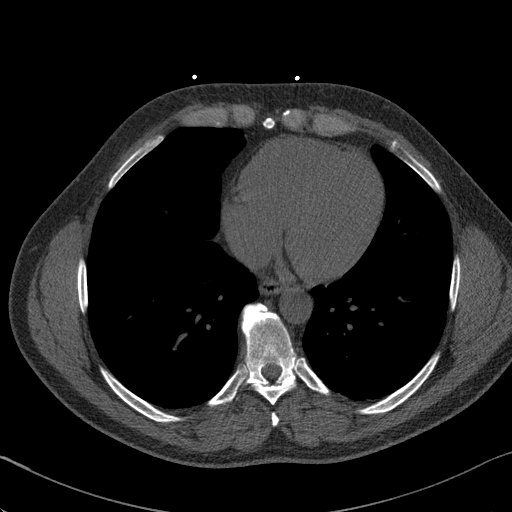
[im 34/68  vessel]
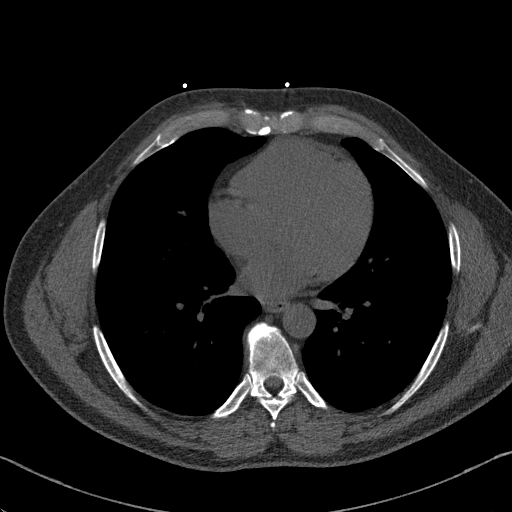
[im 45/68  vessel]
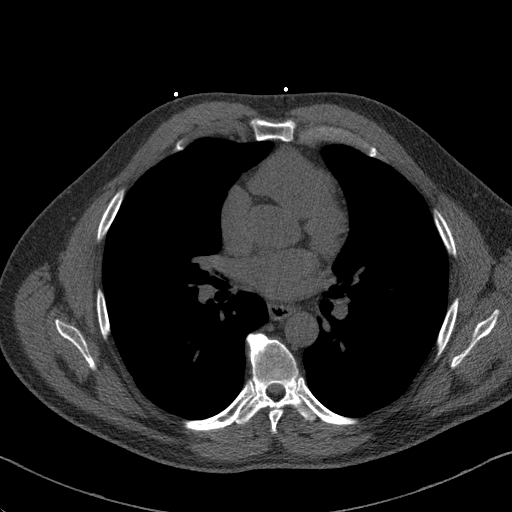
[im 56/68  vessel]
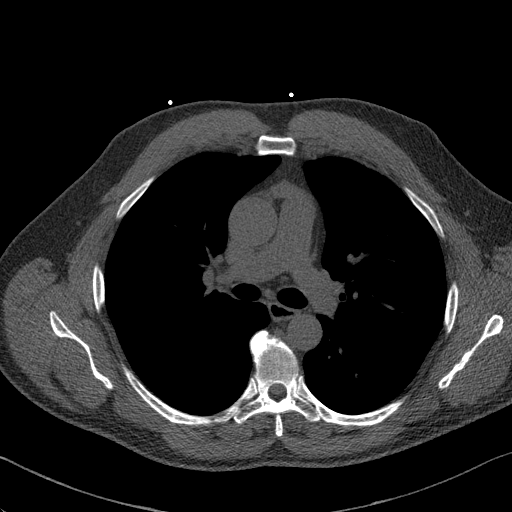
[im 56/68  lung]
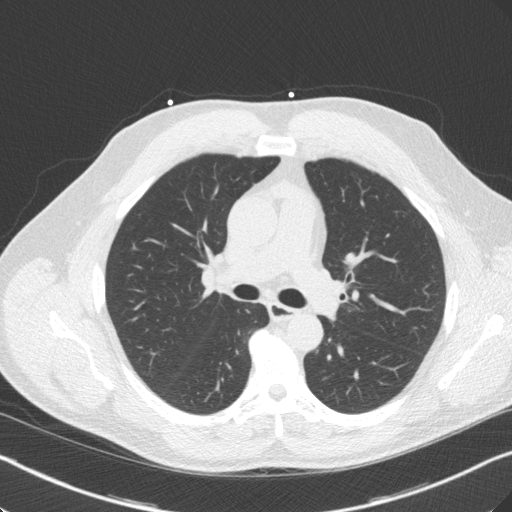

[Series 4: cascseq 2.0 br59 lung · axial · 0.71mm/px · z∈[-235,-147]mm · 5 of 68 slices shown]
[im 12/68  lung]
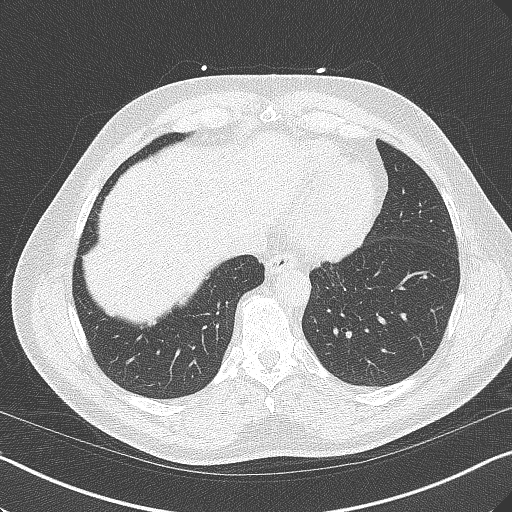
[im 23/68  lung]
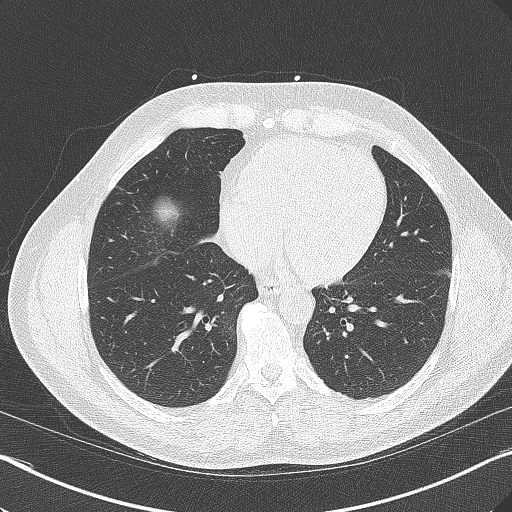
[im 34/68  lung]
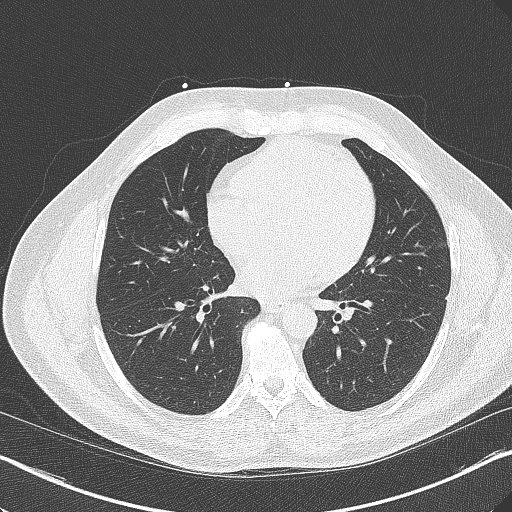
[im 45/68  lung]
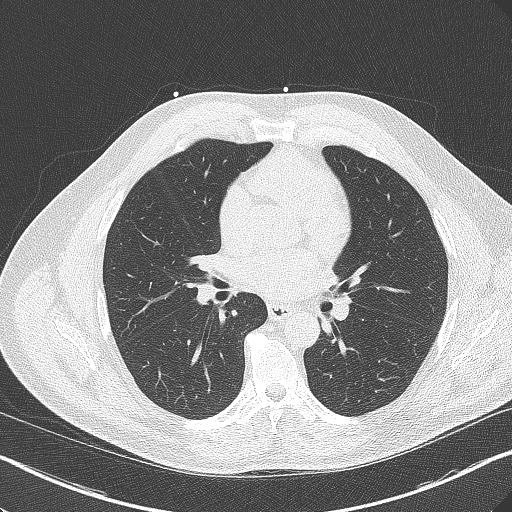
[im 56/68  lung]
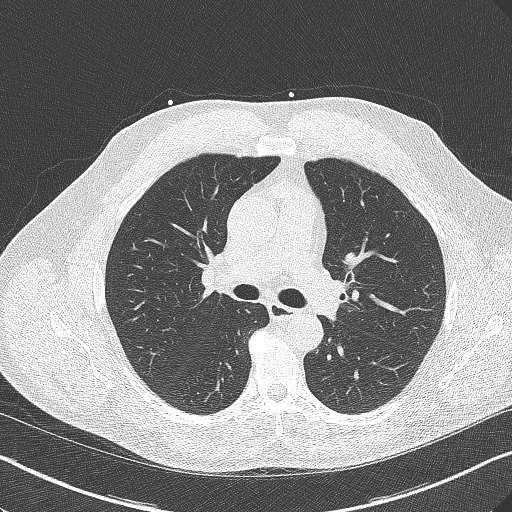

[14 of 20 positions shown; findings below may reference images not displayed]

FINDINGS: Small right-sided fat containing Bochdalek's hernia incidentally
noted. Atherosclerotic calcifications in the proximal ascending
thoracic aorta. Within the visualized portions of the thorax there
are no suspicious appearing pulmonary nodules or masses, there is no
acute consolidative airspace disease, no pleural effusions, no
pneumothorax and no lymphadenopathy. Visualized portions of the
upper abdomen are unremarkable. There are no aggressive appearing
lytic or blastic lesions noted in the visualized portions of the
skeleton.
IMPRESSION: 1.  Aortic Atherosclerosis (0IECX-3SP.P).
FINDINGS: Coronary Calcium Score:

Left main: 0

Left anterior descending artery: 0

Left circumflex artery: 0

Right coronary artery: 0

Total: 0

Pericardium: Normal.

Ascending Aorta: Normal caliber. Ascending aorta measures
approximately 34mm at the mid ascending aorta measured in an axial
plane.

Non-cardiac: See separate report from [REDACTED].
IMPRESSION: Coronary calcium score of 0.



If CAC=0, it is reasonable to withhold statin therapy and reassess
in 5 to 10 years, as long as higher risk conditions are absent
(diabetes mellitus, family history of premature CHD in first degree
relatives (males <55 years; females <65 years), cigarette smoking,
or LDL >=190 mg/dL).

If CAC is 1 to 99, it is reasonable to initiate statin therapy for
patients >=55 years of age.

If CAC is >=100 or >=75th percentile, it is reasonable to initiate
statin therapy at any age.

Cardiology referral should be considered for patients with CAC
scores >=400 or >=75th percentile.

*1618 AHA/ACC/AACVPR/AAPA/ABC/YOEL/NINELA/BENJAMIN G/[employer]/XIAMEI/DOLL/VIOLA
Guideline on the Management of Blood Cholesterol: A Report of the
American College of Cardiology/American Heart Association Task Force
on Clinical Practice Guidelines. J Am Coll Cardiol.
9605;73(24):1981-1116.

*** End of Addendum ***
EXAM:
OVER-READ INTERPRETATION  CT CHEST

The following report is an over-read performed by radiologist Dr.
Jocelita Herbst [REDACTED] on 10/17/2021. This
over-read does not include interpretation of cardiac or coronary
anatomy or pathology. The coronary calcium score interpretation by
the cardiologist is attached.
FINDINGS: Small right-sided fat containing Bochdalek's hernia incidentally
noted. Atherosclerotic calcifications in the proximal ascending
thoracic aorta. Within the visualized portions of the thorax there
are no suspicious appearing pulmonary nodules or masses, there is no
acute consolidative airspace disease, no pleural effusions, no
pneumothorax and no lymphadenopathy. Visualized portions of the
upper abdomen are unremarkable. There are no aggressive appearing
lytic or blastic lesions noted in the visualized portions of the
skeleton.
IMPRESSION: 1.  Aortic Atherosclerosis (0IECX-3SP.P).

## 2023-03-21 ENCOUNTER — Ambulatory Visit: Payer: BC Managed Care – PPO | Admitting: Family Medicine

## 2023-03-21 ENCOUNTER — Encounter: Payer: Self-pay | Admitting: Family Medicine

## 2023-03-21 ENCOUNTER — Encounter: Payer: Self-pay | Admitting: *Deleted

## 2023-03-21 VITALS — BP 124/82 | HR 82 | Temp 97.8°F | Ht 66.0 in | Wt 202.1 lb

## 2023-03-21 DIAGNOSIS — M2342 Loose body in knee, left knee: Secondary | ICD-10-CM

## 2023-03-21 DIAGNOSIS — M2392 Unspecified internal derangement of left knee: Secondary | ICD-10-CM

## 2023-03-21 DIAGNOSIS — M25562 Pain in left knee: Secondary | ICD-10-CM | POA: Diagnosis not present
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
## 2023-03-25 ENCOUNTER — Telehealth: Payer: Self-pay | Admitting: Family Medicine

## 2023-03-25 DIAGNOSIS — M2342 Loose body in knee, left knee: Secondary | ICD-10-CM

## 2023-03-25 DIAGNOSIS — M2392 Unspecified internal derangement of left knee: Secondary | ICD-10-CM

## 2023-03-25 DIAGNOSIS — M25562 Pain in left knee: Secondary | ICD-10-CM

## 2023-03-25 NOTE — Telephone Encounter (Signed)
This is very strange.  I have never sent an MRI to [facility].  Can you all check on this to see what is going on?  It looks like he has an order for [facility] for his MRI.   If he would like to have a consult with [facility] instead, then I can make that referral for him.

## 2023-03-25 NOTE — Telephone Encounter (Signed)
Patient went to [facility] to have MRI done on left knee.He said that they are needing a order or referral sent over.   [facility] Patient would like a call once order is sent over [phone]

## 2023-03-25 NOTE — Telephone Encounter (Signed)
I have reviewed the referral order and it was sent to [facility] ([facility] in [place])   I'm not sure where the [facility] is coming from for the MRI. There is no mention of [facility] in his referral notes and the order was sent directly to [facility].  The patient hasn't even scheduled the MRI - [facility] will call him to schedule or he can call them directly.  They are located at [address], [place], Phone: [phone]   I will need a referral to [facility] if an actual referral/consult is needed.  Thanks!

## 2023-03-26 ENCOUNTER — Encounter: Payer: Self-pay | Admitting: *Deleted

## 2023-03-26 NOTE — Telephone Encounter (Signed)
Noted.   Please see the referral for updates and communication.

## 2023-03-26 NOTE — Telephone Encounter (Signed)
Can you call?  The message we received is a little bit confusing.  He can call [facility] to set up his MRI appointment - please give the number below.   I am not sure where [facility] is coming from.  Would he rather for me to consult their offic?  I am happy to do so, but as far as I know, I cannot order an MRI with their machine.

## 2023-03-26 NOTE — Telephone Encounter (Signed)
Patient went to [facility] on his own to see if they could see him sooner than where he was referred to,because he said that he was in pain.and didn't know how long it would take to get into where he was referred to.However [facility] told him he needed a referral to be seen there.

## 2023-03-26 NOTE — Telephone Encounter (Signed)
I have placed a consult to [facility] in [place].

## 2023-03-27 ENCOUNTER — Telehealth: Payer: Self-pay | Admitting: Family Medicine

## 2023-03-27 MED ORDER — TRAMADOL HCL 50 MG PO TABS: 50.0000 mg | ORAL_TABLET | Freq: Three times a day (TID) | ORAL | 0 refills | Status: DC | PRN

## 2023-03-27 NOTE — Addendum Note (Signed)
Addended by: Hannah Beat on: 03/27/2023 08:43 AM   Modules accepted: Orders

## 2023-03-27 NOTE — Telephone Encounter (Signed)
Daylynn notified as instructed by telephone.  He is waiting on the referral to [facility].  He received a MyChart message from [facility] yesterday in regards to the referral.  He is waiting on [facility] to call him to  scheduling him an appointment.

## 2023-03-27 NOTE — Telephone Encounter (Signed)
Please call  There is a lot of confusion with this case.  I originally ordered an MRI of his knee, but he called in several times asking to be referred to [facility] directly.  I have made that referral.  He still has an open order for an MRI, but he never made that appointment.  My assumption was that he wanted to see [facility] as he requested, and he will follow-up with them for consultation.  I sent him in tramadol for pain, and he can continue to take Celebrex for pain.

## 2023-03-27 NOTE — Telephone Encounter (Signed)
Patient called in and stated that he seen Dr. Patsy Lager for his knee pain. He was wanting to know if he could call something in for the pain until he gets his MRI. Thank you!

## 2023-03-28 NOTE — Telephone Encounter (Signed)
Dr Patsy Lager,   Per [facility] they contacted the patient and he declined the MRI  Notes on the MRI order on the appt desk:                 03/28/23 1ST ATTEMPT PT DECLINED TO SCHEDULE/KF

## 2023-03-29 ENCOUNTER — Encounter: Payer: Self-pay | Admitting: *Deleted

## 2023-03-29 ENCOUNTER — Telehealth: Payer: Self-pay

## 2023-03-29 NOTE — Telephone Encounter (Signed)
Do we know which orthopedic office called [place]??

## 2023-03-29 NOTE — Telephone Encounter (Signed)
Received call from orthopedics office. Would like for our office to call and let patient know that MRI referral was not sent to them and patient should receive call to set up MRI from another office.

## 2023-03-29 NOTE — Telephone Encounter (Signed)
Tried to call Mr. Michelotti.  He does not have his voicemail set up so I was unable to leave a message.  Will send patient a MyChart message about call [facility] to set up his MRI.  [facility] has already sent him a MyChart as well about this.

## 2023-04-18 DIAGNOSIS — S4991XA Unspecified injury of right shoulder and upper arm, initial encounter: Secondary | ICD-10-CM | POA: Diagnosis not present

## 2023-04-18 DIAGNOSIS — S46001A Unspecified injury of muscle(s) and tendon(s) of the rotator cuff of right shoulder, initial encounter: Secondary | ICD-10-CM | POA: Diagnosis not present

## 2023-04-18 DIAGNOSIS — M25511 Pain in right shoulder: Secondary | ICD-10-CM | POA: Diagnosis not present

## 2023-04-23 DIAGNOSIS — S4991XA Unspecified injury of right shoulder and upper arm, initial encounter: Secondary | ICD-10-CM | POA: Diagnosis not present

## 2023-04-23 DIAGNOSIS — R29898 Other symptoms and signs involving the musculoskeletal system: Secondary | ICD-10-CM | POA: Diagnosis not present

## 2023-04-23 DIAGNOSIS — M25511 Pain in right shoulder: Secondary | ICD-10-CM | POA: Diagnosis not present

## 2023-04-23 DIAGNOSIS — M7541 Impingement syndrome of right shoulder: Secondary | ICD-10-CM | POA: Diagnosis not present

## 2023-08-17 ENCOUNTER — Other Ambulatory Visit: Payer: Self-pay | Admitting: Family Medicine

## 2023-08-17 DIAGNOSIS — I10 Essential (primary) hypertension: Secondary | ICD-10-CM

## 2023-08-17 DIAGNOSIS — E785 Hyperlipidemia, unspecified: Secondary | ICD-10-CM

## 2023-08-17 DIAGNOSIS — R7303 Prediabetes: Secondary | ICD-10-CM

## 2023-08-17 DIAGNOSIS — Z125 Encounter for screening for malignant neoplasm of prostate: Secondary | ICD-10-CM

## 2023-08-23 ENCOUNTER — Other Ambulatory Visit: Payer: BC Managed Care – PPO

## 2023-08-30 ENCOUNTER — Ambulatory Visit: Payer: BC Managed Care – PPO | Admitting: Family Medicine

## 2023-08-30 ENCOUNTER — Encounter: Payer: Self-pay | Admitting: Family Medicine

## 2023-08-30 VITALS — BP 120/84 | HR 66 | Temp 97.8°F | Ht 66.0 in | Wt 205.0 lb

## 2023-08-30 DIAGNOSIS — I10 Essential (primary) hypertension: Secondary | ICD-10-CM | POA: Diagnosis not present

## 2023-08-30 DIAGNOSIS — E66811 Obesity, class 1: Secondary | ICD-10-CM

## 2023-08-30 DIAGNOSIS — Z23 Encounter for immunization: Secondary | ICD-10-CM | POA: Diagnosis not present

## 2023-08-30 DIAGNOSIS — E785 Hyperlipidemia, unspecified: Secondary | ICD-10-CM

## 2023-08-30 DIAGNOSIS — Z125 Encounter for screening for malignant neoplasm of prostate: Secondary | ICD-10-CM

## 2023-08-30 DIAGNOSIS — Z0000 Encounter for general adult medical examination without abnormal findings: Secondary | ICD-10-CM

## 2023-08-30 DIAGNOSIS — R7303 Prediabetes: Secondary | ICD-10-CM

## 2023-08-30 DIAGNOSIS — Z6833 Body mass index (BMI) 33.0-33.9, adult: Secondary | ICD-10-CM

## 2023-08-30 DIAGNOSIS — S46011D Strain of muscle(s) and tendon(s) of the rotator cuff of right shoulder, subsequent encounter: Secondary | ICD-10-CM

## 2023-08-30 MED ORDER — AMLODIPINE BESYLATE 5 MG PO TABS: 5.0000 mg | ORAL_TABLET | Freq: Every day | ORAL | 4 refills | Status: DC

## 2023-08-30 NOTE — Patient Instructions (Addendum)
Flu shot and 2nd shingles shot today  Labs today  Continue amlodipine.  We will plan for tetanus shot next year Return as needed or in 1 year for next physical.  Concern for R rotator cuff tendon tear - call orthopedist for follow up when ready: Dorthula Nettles, DO  [address]  [place], [place] [address]  [phone] (Work)

## 2023-08-30 NOTE — Assessment & Plan Note (Signed)
Continue to encourage healthy diet and lifestyle choices to affect sustainable weight loss.  °

## 2023-08-30 NOTE — Assessment & Plan Note (Signed)
Chronic, stable on amlodipine - continue.

## 2023-08-30 NOTE — Assessment & Plan Note (Signed)
Preventative protocols reviewed and updated unless pt declined. Discussed healthy diet and lifestyle.  
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
## 2023-08-30 NOTE — Assessment & Plan Note (Addendum)
Notes persistent pain and weakness to right shoulder specifically with testing external rotation against resistance. Concern for infraspinatus or teres minor tear.  Last saw [facility] 04/2023 - with plan at that time to f/u if not improving for MRI and further eval.  Recommend return to orthopedist. He will call them in the new year to schedule f/u appt. # provided.

## 2023-08-30 NOTE — Assessment & Plan Note (Signed)
Update A1c ?

## 2023-08-30 NOTE — Assessment & Plan Note (Addendum)
Chronic, stable off medication. Update FLP The 10-year ASCVD risk score (Arnett DK, et al., 2019) is: 9%   Values used to calculate the score:     Age: 57 years     Sex: Male     Is Non-Hispanic African American: Yes     Diabetic: No     Tobacco smoker: No     Systolic Blood Pressure: 120 mmHg     Is BP treated: Yes     HDL Cholesterol: 64.4 mg/dL     Total Cholesterol: 197 mg/dL

## 2023-08-31 LAB — HEMOGLOBIN A1C
Hgb A1c MFr Bld: 6 %{Hb} — ABNORMAL HIGH (ref <5.7)
Mean Plasma Glucose: 126 mg/dL
eAG (mmol/L): 7 mmol/L

## 2023-08-31 LAB — COMPREHENSIVE METABOLIC PANEL
AG Ratio: 1.7 (calc) (ref 1.0–2.5)
ALT: 29 U/L (ref 9–46)
AST: 23 U/L (ref 10–35)
Albumin: 4.8 g/dL (ref 3.6–5.1)
Alkaline phosphatase (APISO): 60 U/L (ref 35–144)
BUN: 17 mg/dL (ref 7–25)
CO2: 24 mmol/L (ref 20–32)
Calcium: 9.7 mg/dL (ref 8.6–10.3)
Chloride: 101 mmol/L (ref 98–110)
Creat: 1.05 mg/dL (ref 0.70–1.30)
Globulin: 2.9 g/dL (ref 1.9–3.7)
Glucose, Bld: 96 mg/dL (ref 65–99)
Potassium: 4.4 mmol/L (ref 3.5–5.3)
Sodium: 138 mmol/L (ref 135–146)
Total Bilirubin: 0.5 mg/dL (ref 0.2–1.2)
Total Protein: 7.7 g/dL (ref 6.1–8.1)

## 2023-08-31 LAB — LIPID PANEL
Cholesterol: 242 mg/dL — ABNORMAL HIGH (ref <200)
HDL: 74 mg/dL (ref >=40)
LDL Cholesterol (Calc): 148 mg/dL — ABNORMAL HIGH
Non-HDL Cholesterol (Calc): 168 mg/dL — ABNORMAL HIGH (ref <130)
Total CHOL/HDL Ratio: 3.3 (calc) (ref <5.0)
Triglycerides: 92 mg/dL (ref <150)

## 2023-08-31 LAB — PSA: PSA: 0.35 ng/mL (ref <=4.00)

## 2023-08-31 LAB — MICROALBUMIN / CREATININE URINE RATIO
Creatinine, Urine: 101 mg/dL (ref 20–320)
Microalb, Ur: <0.2 mg/dL

## 2023-09-05 ENCOUNTER — Other Ambulatory Visit: Payer: Self-pay | Admitting: Family Medicine

## 2023-09-05 DIAGNOSIS — E785 Hyperlipidemia, unspecified: Secondary | ICD-10-CM

## 2023-09-09 ENCOUNTER — Other Ambulatory Visit: Payer: Self-pay | Admitting: Family Medicine

## 2024-01-21 NOTE — Progress Notes (Deleted)
   Todd Reuter T. Haruto Demaria, MD, CAQ Sports Medicine [facility] at [facility] [address] [place], [address]  Phone: [phone]  FAX: [phone]  Todd Holt - 58 y.o. male  MRN [id number]  Date of Birth: 1966/05/25  Date: 01/22/2024  PCP: Eustaquio Boyden, MD  Referral: Eustaquio Boyden, MD  No chief complaint on file.  Subjective:   Todd Holt is a 58 y.o. very pleasant male patient with There is no height or weight on file to calculate BMI. who presents with the following:  Patient presents with some ongoing nasal issues.    Review of Systems is noted in the HPI, as appropriate  Objective:   There were no vitals taken for this visit.  GEN: No acute distress; alert,appropriate. PULM: Breathing comfortably in no respiratory distress PSYCH: Normally interactive.   Laboratory and Imaging Data:  Assessment and Plan:   ***

## 2024-01-22 ENCOUNTER — Ambulatory Visit: Payer: BC Managed Care – PPO | Admitting: Family Medicine

## 2024-10-13 ENCOUNTER — Encounter: Admitting: Family Medicine

## 2024-10-28 ENCOUNTER — Other Ambulatory Visit: Payer: Self-pay | Admitting: Family Medicine

## 2024-11-01 NOTE — Patient Instructions (Signed)
 Healthy Eating, Adult Healthy eating may help you get and keep a healthy body weight, reduce the risk of chronic disease, and live a long and productive life. It is important to follow a healthy eating pattern. Your nutritional and calorie needs should be met mainly by different nutrient-rich foods. What are tips for following this plan? Reading food labels Read labels and choose the following: Reduced or low sodium products. Juices with 100% fruit juice. Foods with low saturated fats (<3 g per serving) and high polyunsaturated and monounsaturated fats. Foods with whole grains, such as whole wheat, cracked wheat, brown rice, and wild rice. Whole grains that are fortified with folic acid . This is recommended for females who are pregnant or who want to become pregnant. Read labels and do not eat or drink the following: Foods or drinks with added sugars. These include foods that contain brown sugar, corn sweetener, corn syrup, dextrose , fructose, glucose, high-fructose corn syrup, honey, invert sugar, lactose, malt syrup, maltose, molasses, raw sugar, sucrose, trehalose, or turbinado sugar. Limit your intake of added sugars to less than 10% of your total daily calories. Do not eat more than the following amounts of added sugar per day: 6 teaspoons (25 g) for females. 9 teaspoons (38 g) for males. Foods that contain processed or refined starches and grains. Refined grain products, such as white flour, degermed cornmeal, white bread, and white rice. Shopping Choose nutrient-rich snacks, such as vegetables, whole fruits, and nuts. Avoid high-calorie and high-sugar snacks, such as potato chips, fruit snacks, and candy. Use oil-based dressings and spreads on foods instead of solid fats such as butter, margarine, sour cream, or cream cheese. Limit pre-made sauces, mixes, and instant products such as flavored rice, instant noodles, and ready-made pasta. Try more plant-protein sources, such as tofu,  tempeh, black beans, edamame, lentils, nuts, and seeds. Explore eating plans such as the Mediterranean diet or vegetarian diet. Try heart-healthy dips made with beans and healthy fats like hummus and guacamole. Vegetables go great with these. Cooking Use oil to saut or stir-fry foods instead of solid fats such as butter, margarine, or lard. Try baking, boiling, grilling, or broiling instead of frying. Remove the fatty part of meats before cooking. Steam vegetables in water  or broth. Meal planning  At meals, imagine dividing your plate into fourths: One-half of your plate is fruits and vegetables. One-fourth of your plate is whole grains. One-fourth of your plate is protein, especially lean meats, poultry, eggs, tofu, beans, or nuts. Include low-fat dairy as part of your daily diet. Lifestyle Choose healthy options in all settings, including home, work, school, restaurants, or stores. Prepare your food safely: Wash your hands after handling raw meats. Where you prepare food, keep surfaces clean by regularly washing with hot, soapy water . Keep raw meats separate from ready-to-eat foods, such as fruits and vegetables. Cook seafood, meat, poultry, and eggs to the recommended temperature. Get a food thermometer. Store foods at safe temperatures. In general: Keep cold foods at 34F (4.4C) or below. Keep hot foods at 134F (60C) or above. Keep your freezer at [facility] (-17.8C) or below. Foods are not safe to eat if they have been between the temperatures of 40-134F (4.4-60C) for more than 2 hours. What foods should I eat? Fruits Aim to eat 1-2 cups of fresh, canned (in natural juice), or frozen fruits each day. One cup of fruit equals 1 small apple, 1 large banana, 8 large strawberries, 1 cup (237 g) canned fruit,  cup (82 g) dried fruit,  or 1 cup (240 mL) 100% juice. Vegetables Aim to eat 2-4 cups of fresh and frozen vegetables each day, including different varieties and colors. One cup  of vegetables equals 1 cup (91 g) broccoli or cauliflower florets, 2 medium carrots, 2 cups (150 g) raw, leafy greens, 1 large tomato, 1 large bell pepper, 1 large sweet potato, or 1 medium white potato. Grains Aim to eat 5-10 ounce-equivalents of whole grains each day. Examples of 1 ounce-equivalent of grains include 1 slice of bread, 1 cup (40 g) ready-to-eat cereal, 3 cups (24 g) popcorn, or  cup (93 g) cooked rice. Meats and other proteins Try to eat 5-7 ounce-equivalents of protein each day. Examples of 1 ounce-equivalent of protein include 1 egg,  oz nuts (12 almonds, 24 pistachios, or 7 walnut halves), 1/4 cup (90 g) cooked beans, 6 tablespoons (90 g) hummus or 1 tablespoon (16 g) peanut butter. A cut of meat or fish that is the size of a deck of cards is about 3-4 ounce-equivalents (85 g). Of the protein you eat each week, try to have at least 8 sounce (227 g) of seafood. This is about 2 servings per week. This includes salmon, trout, herring, sardines, and anchovies. Dairy Aim to eat 3 cup-equivalents of fat-free or low-fat dairy each day. Examples of 1 cup-equivalent of dairy include 1 cup (240 mL) milk, 8 ounces (250 g) yogurt, 1 ounces (44 g) natural cheese, or 1 cup (240 mL) fortified soy milk. Fats and oils Aim for about 5 teaspoons (21 g) of fats and oils per day. Choose monounsaturated fats, such as canola and olive oils, mayonnaise made with olive oil or avocado oil, avocados, peanut butter, and most nuts, or polyunsaturated fats, such as sunflower, corn, and soybean oils, walnuts, pine nuts, sesame seeds, sunflower seeds, and flaxseed. Beverages Aim for 6 eight-ounce glasses of water  per day. Limit coffee to 3-5 eight-ounce cups per day. Limit caffeinated beverages that have added calories, such as soda and energy drinks. If you drink alcohol: Limit how much you have to: 0-1 drink a day if you are male. 0-2 drinks a day if you are male. Know how much alcohol is in your drink.  In the [place] one drink is one 12 oz bottle of beer (355 mL), one 5 oz glass of wine (148 mL), or one 1 oz glass of hard liquor (44 mL). Seasoning and other foods Try not to add too much salt to your food. Try using herbs and spices instead of salt. Try not to add sugar to food. This information is based on [place] nutrition guidelines. To learn more, visit DisposableN[employer].be. Exact amounts may vary. You may need different amounts. This information is not intended to replace advice given to you by your health care provider. Make sure you discuss any questions you have with your health care provider. Document Revised: 08/06/2022 Document Reviewed: 08/06/2022 Elsevier Patient Education  2024 [employer].

## 2024-11-04 ENCOUNTER — Encounter: Payer: Self-pay | Admitting: Nurse Practitioner

## 2024-11-04 ENCOUNTER — Ambulatory Visit: Payer: Self-pay | Admitting: Nurse Practitioner

## 2024-11-04 VITALS — BP 131/80 | HR 65 | Temp 98.2°F | Resp 15 | Ht 65.98 in | Wt 208.4 lb

## 2024-11-04 DIAGNOSIS — Z0289 Encounter for other administrative examinations: Secondary | ICD-10-CM | POA: Insufficient documentation

## 2024-11-04 NOTE — Assessment & Plan Note (Signed)
 DOT Certificate provided x 1 years due to HTN   Hearing test: Pass at 6 feet whisper Vision: 20/25 R, 20/25 L, 20/30 Both Urine 1.010, Trace Protein, Neg Glucose, Neg Hematuria
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
## 2025-10-27 ENCOUNTER — Encounter: Admitting: Nurse Practitioner
